# Patient Record
Sex: Female | Born: 1970 | Race: Black or African American | Hispanic: No | Smoking: Never smoker
Health system: Southern US, Community
[De-identification: ages and names within clinical notes are randomized; demographics above are authoritative.]

## PROBLEM LIST (undated history)

## (undated) ENCOUNTER — Inpatient Hospital Stay (HOSPITAL_COMMUNITY): Payer: Self-pay

## (undated) DIAGNOSIS — K219 Gastro-esophageal reflux disease without esophagitis: Secondary | ICD-10-CM

## (undated) DIAGNOSIS — O0383 Metabolic disorder following complete or unspecified spontaneous abortion: Secondary | ICD-10-CM

## (undated) DIAGNOSIS — R51 Headache: Secondary | ICD-10-CM

## (undated) DIAGNOSIS — D219 Benign neoplasm of connective and other soft tissue, unspecified: Secondary | ICD-10-CM

## (undated) DIAGNOSIS — J45909 Unspecified asthma, uncomplicated: Secondary | ICD-10-CM

## (undated) DIAGNOSIS — M2550 Pain in unspecified joint: Secondary | ICD-10-CM

## (undated) DIAGNOSIS — B999 Unspecified infectious disease: Secondary | ICD-10-CM

## (undated) DIAGNOSIS — M479 Spondylosis, unspecified: Secondary | ICD-10-CM

## (undated) HISTORY — DX: Metabolic disorder following complete or unspecified spontaneous abortion: O03.83

## (undated) HISTORY — DX: Headache: R51

## (undated) HISTORY — PX: APPENDECTOMY: SHX54

## (undated) HISTORY — DX: Benign neoplasm of connective and other soft tissue, unspecified: D21.9

## (undated) HISTORY — DX: Unspecified asthma, uncomplicated: J45.909

## (undated) HISTORY — PX: NASAL SINUS SURGERY: SHX719

## (undated) HISTORY — DX: Unspecified infectious disease: B99.9

## (undated) HISTORY — DX: Gastro-esophageal reflux disease without esophagitis: K21.9

## (undated) HISTORY — DX: Pain in unspecified joint: M25.50

---

## 2006-12-20 ENCOUNTER — Encounter: Admission: RE | Admit: 2006-12-20 | Discharge: 2006-12-20 | Payer: Self-pay | Admitting: Family Medicine

## 2007-07-01 ENCOUNTER — Other Ambulatory Visit: Admission: RE | Admit: 2007-07-01 | Discharge: 2007-07-01 | Payer: Self-pay | Admitting: Obstetrics and Gynecology

## 2008-10-23 ENCOUNTER — Ambulatory Visit (HOSPITAL_COMMUNITY): Admission: RE | Admit: 2008-10-23 | Discharge: 2008-10-23 | Payer: Self-pay | Admitting: Obstetrics and Gynecology

## 2010-07-24 DIAGNOSIS — D219 Benign neoplasm of connective and other soft tissue, unspecified: Secondary | ICD-10-CM

## 2010-07-24 HISTORY — DX: Benign neoplasm of connective and other soft tissue, unspecified: D21.9

## 2011-08-02 ENCOUNTER — Encounter: Payer: Self-pay | Admitting: Cardiovascular Disease

## 2011-08-02 ENCOUNTER — Encounter: Payer: Self-pay | Admitting: *Deleted

## 2011-08-02 ENCOUNTER — Ambulatory Visit (INDEPENDENT_AMBULATORY_CARE_PROVIDER_SITE_OTHER): Payer: Self-pay | Admitting: Cardiovascular Disease

## 2011-08-02 DIAGNOSIS — R079 Chest pain, unspecified: Secondary | ICD-10-CM | POA: Insufficient documentation

## 2011-08-02 DIAGNOSIS — J45909 Unspecified asthma, uncomplicated: Secondary | ICD-10-CM | POA: Insufficient documentation

## 2011-08-02 DIAGNOSIS — R0989 Other specified symptoms and signs involving the circulatory and respiratory systems: Secondary | ICD-10-CM | POA: Insufficient documentation

## 2011-08-02 HISTORY — DX: Other specified symptoms and signs involving the circulatory and respiratory systems: R09.89

## 2011-08-02 NOTE — Patient Instructions (Addendum)
Your physician recommends that you schedule a follow-up appointment in: AS NEEDED Your physician recommends that you continue on your current medications as directed. Please refer to the Current Medication list given to you today. Your physician has requested that you have a stress echocardiogram. For further information please visit [email or web address] Please follow instruction sheet as given. DX CHEST PAIN  Your physician has requested that you have an abdominal aorta duplex. During this test, an ultrasound is used to evaluate the aorta. Allow 30 minutes for this exam. Do not eat after midnight the day before and avoid carbonated beverages DX PALPABLE  ABD AORTA

## 2011-08-02 NOTE — Assessment & Plan Note (Signed)
Unusually prominent on exam as she is not that thin.  F/U US R/O AAA

## 2011-08-02 NOTE — Progress Notes (Signed)
Patient ID: Jamie Melton, female   DOB: 1970/08/17, 41 y.o.   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  Has allergies and asthma with inhaler.  No previously documented congenital or heart problem Originally from [place]  Works weekends on [address]  Pain most days this past month.  Atypical.  Resting  Can awake her from sleep.  Sharp.  An uneasy feeling in chest.  Lasts minutes.  No associated pleurisy or positional component.  Persistant.  But not worsening.  No gi overtones.  Walks in park 3x/week with occasional tightness from asthma but no pain.  No fever, arthritis, or recent trauma  No CRF;s   ROS: Denies fever, malais, weight loss, blurry vision, decreased visual acuity, cough, sputum, SOB, hemoptysis, pleuritic pain, palpitaitons, heartburn, abdominal pain, melena, lower extremity edema, claudication, or rash.  All other systems reviewed and negative   General: Affect appropriate Healthy:  appears stated age HEENT: normal Neck supple with no adenopathy JVP normal no bruits no thyromegaly Lungs clear with no wheezing and good diaphragmatic motion Heart:  S1/S2 no murmur,rub, gallop or click PMI normal Abdomen: benighn, BS positve, no tenderness, abdominal aorta seems prominent and palpable but not tender no bruit.  No HSM or HJR Distal pulses intact with no bruits No edema Neuro non-focal Skin warm and dry No muscular weakness  Medications Current Outpatient Prescriptions  Medication Sig Dispense Refill  . albuterol (PROVENTIL,VENTOLIN) 90 MCG/ACT inhaler Inhale 2 puffs into the lungs every 6 (six) hours as needed.      . diphenhydrAMINE (SOMINEX) 25 MG tablet Take 25 mg by mouth as needed.      . fexofenadine (ALLEGRA) 180 MG tablet Take 180 mg by mouth daily.      . multivitamin (THERAGRAN) per tablet Take 1 tablet by mouth daily.      Marland Kitchen acetaminophen (TYLENOL) 500 MG tablet Take 500 mg by mouth every 6 (six) hours as needed.        Allergies Review of  patient's allergies indicates no known allergies.  Family History: No family history on file.  Social History: History   Social History  . Marital Status: Married    Spouse Name: N/A    Number of Children: N/A  . Years of Education: N/A   Occupational History  . Not on file.   Social History Main Topics  . Smoking status: Never Smoker   . Smokeless tobacco: Not on file  . Alcohol Use: Yes     occasional glass of wine  . Drug Use: No  . Sexually Active:    Other Topics Concern  . Not on file   Social History Narrative  . No narrative on file    Electrocardiogram: NSR rate 76  LAD otherwise normal ECG  Assessment and Plan

## 2011-08-02 NOTE — Assessment & Plan Note (Signed)
Stable continue PRN inhaler

## 2011-08-02 NOTE — Assessment & Plan Note (Signed)
Atypical ECG and exam unrevealing  Stress echo

## 2011-08-04 ENCOUNTER — Other Ambulatory Visit (HOSPITAL_COMMUNITY): Payer: Self-pay | Admitting: Radiology

## 2011-08-04 ENCOUNTER — Ambulatory Visit (HOSPITAL_COMMUNITY): Payer: 59 | Attending: Cardiology | Admitting: Radiology

## 2011-08-04 ENCOUNTER — Ambulatory Visit (HOSPITAL_BASED_OUTPATIENT_CLINIC_OR_DEPARTMENT_OTHER): Payer: 59 | Admitting: Radiology

## 2011-08-04 DIAGNOSIS — R079 Chest pain, unspecified: Secondary | ICD-10-CM | POA: Insufficient documentation

## 2011-08-04 DIAGNOSIS — J45909 Unspecified asthma, uncomplicated: Secondary | ICD-10-CM | POA: Insufficient documentation

## 2011-08-04 DIAGNOSIS — R072 Precordial pain: Secondary | ICD-10-CM

## 2011-08-04 DIAGNOSIS — R0989 Other specified symptoms and signs involving the circulatory and respiratory systems: Secondary | ICD-10-CM

## 2011-08-07 ENCOUNTER — Encounter (INDEPENDENT_AMBULATORY_CARE_PROVIDER_SITE_OTHER): Payer: 59 | Admitting: Cardiology

## 2011-08-07 ENCOUNTER — Telehealth: Payer: Self-pay | Admitting: Cardiovascular Disease

## 2011-08-07 DIAGNOSIS — R109 Unspecified abdominal pain: Secondary | ICD-10-CM

## 2011-08-07 DIAGNOSIS — R0989 Other specified symptoms and signs involving the circulatory and respiratory systems: Secondary | ICD-10-CM

## 2011-08-07 NOTE — Telephone Encounter (Signed)
FU Call: Pt returning call to Christine. Please return pt call to discuss further.  

## 2011-08-07 NOTE — Telephone Encounter (Signed)
PT  AWARE OF STRESS ECHO RESULTS ./CY 

## 2012-08-15 ENCOUNTER — Ambulatory Visit: Payer: 59 | Admitting: Obstetrics and Gynecology

## 2012-08-15 DIAGNOSIS — O26849 Uterine size-date discrepancy, unspecified trimester: Secondary | ICD-10-CM

## 2012-08-15 DIAGNOSIS — Z331 Pregnant state, incidental: Secondary | ICD-10-CM

## 2012-08-15 LAB — POCT URINALYSIS DIPSTICK
Bilirubin, UA: NEGATIVE
Blood, UA: NEGATIVE
Glucose, UA: NEGATIVE
Ketones, UA: NEGATIVE
Spec Grav, UA: 1.02

## 2012-08-15 NOTE — Progress Notes (Signed)
NOB interview. U/S ordered for viability per protocol due to hx SAB.

## 2012-08-16 LAB — PRENATAL PANEL VII
Antibody Screen: NEGATIVE
Basophils Relative: 1 % (ref 0–1)
Eosinophils Absolute: 0.8 10*3/uL — ABNORMAL HIGH (ref 0.0–0.7)
Hemoglobin: 12 g/dL (ref 12.0–15.0)
Hepatitis B Surface Ag: NEGATIVE
MCH: 31.7 pg (ref 26.0–34.0)
MCHC: 34.3 g/dL (ref 30.0–36.0)
Monocytes Absolute: 0.7 10*3/uL (ref 0.1–1.0)
Monocytes Relative: 7 % (ref 3–12)
Neutrophils Relative %: 69 % (ref 43–77)
Rh Type: POSITIVE

## 2012-08-16 LAB — GC/CHLAMYDIA PROBE AMP: GC Probe RNA: NEGATIVE

## 2012-08-18 ENCOUNTER — Encounter: Payer: Self-pay | Admitting: Obstetrics and Gynecology

## 2012-08-18 DIAGNOSIS — D219 Benign neoplasm of connective and other soft tissue, unspecified: Secondary | ICD-10-CM | POA: Insufficient documentation

## 2012-08-18 DIAGNOSIS — O09529 Supervision of elderly multigravida, unspecified trimester: Secondary | ICD-10-CM | POA: Insufficient documentation

## 2012-08-19 LAB — HEMOGLOBINOPATHY EVALUATION
Hgb A2 Quant: 3 % (ref 2.2–3.2)
Hgb A: 96.6 % — ABNORMAL LOW (ref 96.8–97.8)

## 2012-08-26 ENCOUNTER — Other Ambulatory Visit: Payer: 59

## 2012-08-29 ENCOUNTER — Ambulatory Visit: Payer: 59 | Admitting: Obstetrics and Gynecology

## 2012-08-29 ENCOUNTER — Ambulatory Visit: Payer: 59

## 2012-08-29 ENCOUNTER — Encounter: Payer: Self-pay | Admitting: Obstetrics and Gynecology

## 2012-08-29 ENCOUNTER — Encounter: Payer: 59 | Admitting: Obstetrics and Gynecology

## 2012-08-29 VITALS — BP 90/66 | Wt 136.0 lb

## 2012-08-29 DIAGNOSIS — Z331 Pregnant state, incidental: Secondary | ICD-10-CM

## 2012-08-29 DIAGNOSIS — O09529 Supervision of elderly multigravida, unspecified trimester: Secondary | ICD-10-CM

## 2012-08-29 DIAGNOSIS — O219 Vomiting of pregnancy, unspecified: Secondary | ICD-10-CM

## 2012-08-29 DIAGNOSIS — O26849 Uterine size-date discrepancy, unspecified trimester: Secondary | ICD-10-CM

## 2012-08-29 LAB — US OB TRANSVAGINAL

## 2012-08-29 MED ORDER — DOXYLAMINE-PYRIDOXINE 10-10 MG PO TBEC: 2.0000 | DELAYED_RELEASE_TABLET | Freq: Every day | ORAL | Status: DC

## 2012-08-29 NOTE — Progress Notes (Signed)
Here for [place] for viability, due to AMA and hx of 1 SAB. Has nausea. Wants genetic screening. [place] today shows SIUP with +FHT. Plan: Keep scheduled NOB visit on 09/19/12 with AVS. Rx Diclegis 20 mg po q hs--if ineffective, may add additional 10 mg in afternoon. Patient to f/u as needed.  Nigel Bridgeman, CNM

## 2012-08-29 NOTE — Progress Notes (Signed)
[redacted]w[redacted]d NOB scheduled 09/19/12 with AVS Pt wants genetic screening. Ultrasound: singleton pregnancy, yolk sac seen, anteverted uterus, CRL is concordant with LMP GA/EDD, normal ovaries/adnexa, cx closed

## 2012-09-07 ENCOUNTER — Other Ambulatory Visit: Payer: Self-pay

## 2012-09-09 ENCOUNTER — Encounter: Payer: 59 | Admitting: Obstetrics and Gynecology

## 2012-09-19 ENCOUNTER — Encounter: Payer: Self-pay | Admitting: Obstetrics and Gynecology

## 2012-09-19 ENCOUNTER — Ambulatory Visit: Payer: 59 | Admitting: Obstetrics and Gynecology

## 2012-09-19 ENCOUNTER — Encounter: Payer: 59 | Admitting: Obstetrics and Gynecology

## 2012-09-19 VITALS — BP 90/60 | Wt 139.0 lb

## 2012-09-19 NOTE — Progress Notes (Signed)
[facility] NEW OB EXAMINATION   Jamie Melton is a 42 y.o. female, G2P0010, who presents at [redacted]w[redacted]d gestation for a new obstetrical examination. Her last menstrual period was not normal.  An ultrasound on August 29, 2012 showed an 8 week and one-day gestation.  Her due date is April 09, 2013.  She has nausea and vomiting of early pregnancy.  Her age is greater than 35.  She has a history of asthma.  The patient has had her aorta evaluated and there is no aneurysm.  The following portions of the patient's history were reviewed and updated as appropriate: allergies, current medications, past family history, past medical history, past social history, past surgical history and problem list.  OB History   Grav Para Term Preterm Abortions TAB SAB Ect Mult Living   2    1  1          Past Medical History  Diagnosis Date  . Myalgia and myositis, unspecified   . Pain in joint, multiple sites   . Unspecified abortion, complicated by metabolic disorder, unspecified   . Infection     UTI  . Headache     FREQUENT  . Fibroid 2012  . Asthma     DR Sharyn Lull    Past Surgical History  Procedure Laterality Date  . Appendectomy    . Nasal sinus surgery      Family History  Problem Relation Age of Onset  . Arthritis Mother   . Kidney disease Mother     STONES  . Thyroid disease Mother   . Hypertension Father     Social History:  reports that she has never smoked. She has never used smokeless tobacco. She reports that  drinks alcohol. She reports that she does not use illicit drugs.  Allergies: No Known Allergies  Medications: I have reviewed the patient's current medications.   Objective:    BP 90/60  Wt 139 lb (63.05 kg)  BMI 26.28 kg/m2  LMP 07/06/2012    Weight:  Wt Readings from Last 1 Encounters:  09/19/12 139 lb (63.05 kg)          BMI: Body mass index is 26.28 kg/(m^2).  General Appearance: Alert, appropriate appearance for age. No acute distress HEENT: Grossly  normal Neck / Thyroid: Supple, no masses, nodes or enlargement Lungs: clear to auscultation bilaterally Back: No CVA tenderness Breast Exam: No masses or nodes.No dimpling, nipple retraction or discharge. Cardiovascular: Regular rate and rhythm. S1, S2, no murmur Gastrointestinal: Soft, non-tender, no masses or organomegaly.                               Fundal height: 11 weeks                               Fetal heart tones audible: yes  ++++++++++++++++++++++++++++++++++++++++++++++++++++++++  Pelvic Exam: External genitalia: normal general appearance Vaginal: normal without tenderness, induration or masses and relaxation: No Cervix: normal appearance Adnexa: normal bimanual exam Uterus: gravid, nontender, 11 weeks size  ++++++++++++++++++++++++++++++++++++++++++++++++++++++++  Lymphatic Exam: Non-palpable nodes in neck, clavicular, axillary, or inguinal regions Neurologic: Normal speech, no tremor  Psychiatric: Alert and oriented, appropriate affect.  Prenatal labs: ABO, Rh: B/POS/-- (01/23 1148) Antibody: NEG (01/23 1148) Rubella:  immune RPR: NON REAC (01/23 1148)  HBsAg: NEGATIVE (01/23 1148)  HIV: NON REACTIVE (01/23 1148)  GBS:   pending until the third  trimester Hemoglobin 12.0 Platelets 218,000 Sickle cell screen negative Urine culture negative Gonorrhea negative Chlamydia negative  Wet Prep:   Previously done:            no                     If no: Whiff:                     Negative                              Clue cells:             no                              PH:                        4.5                              Yeast:                    no                              Trichomoniasis:    no   Assessment:   42 y.o. female G2P0010 at [redacted]w[redacted]d gestation ( EDC is April 09, 2013) by: Ultrasound:                               yes                                Advanced maternal age  Asthma   Plan:    We discussed routine pregnancy  issues:  Toxoplasmosis was reviewed.  The patient was told to avoid cat liter boxes and feces.  The patient was told to avoid predator fish including tuna because of our concerns for mercury consumption.  The patient was told to avoid soft cheeses.  The patient was told to be sure that all lunch meats are well cooked.  Genetic screening was discussed. The patient wants a [facility] screen.  Our model for pregnancy management was reviewed.  Proper diet and exercise reviewed.  Return to office in 4 weeks.  Medications include:  Prenatal vitamins  Mylinda Latina.D.

## 2012-09-19 NOTE — Progress Notes (Signed)
[redacted]w[redacted]d Pt has no complaints

## 2012-09-30 ENCOUNTER — Telehealth: Payer: Self-pay | Admitting: Obstetrics and Gynecology

## 2012-09-30 NOTE — Telephone Encounter (Signed)
TC to pt. Is requesting Rf Diclegis. Pt checked Rx bottle. Has RFs available. Pt to call pharmacy.  Pt verbalizes comprehension. States med is helping.

## 2012-10-17 ENCOUNTER — Other Ambulatory Visit: Payer: Self-pay | Admitting: Obstetrics and Gynecology

## 2012-10-18 LAB — AFP, QUAD SCREEN
AFP: 39.3 IU/mL
Age Alone: 1:75 {titer}
Curr Gest Age: 15.1 wks.days
Down Syndrome Scr Risk Est: 1:1170 {titer}
HCG, Total: 26424 m[IU]/mL
INH: 365.7 pg/mL
MoM for INH: 1.83
Osb Risk: 1:2810 {titer}

## 2013-02-14 ENCOUNTER — Inpatient Hospital Stay (HOSPITAL_COMMUNITY)
Admission: AD | Admit: 2013-02-14 | Discharge: 2013-02-14 | Disposition: A | Discharge status: Home / Self Care | Payer: 59 | Source: Ambulatory Visit | Attending: Obstetrics and Gynecology | Admitting: Obstetrics and Gynecology

## 2013-02-14 ENCOUNTER — Encounter (HOSPITAL_COMMUNITY): Payer: Self-pay | Admitting: *Deleted

## 2013-02-14 DIAGNOSIS — O4703 False labor before 37 completed weeks of gestation, third trimester: Secondary | ICD-10-CM

## 2013-02-14 DIAGNOSIS — R109 Unspecified abdominal pain: Secondary | ICD-10-CM | POA: Insufficient documentation

## 2013-02-14 DIAGNOSIS — O4700 False labor before 37 completed weeks of gestation, unspecified trimester: Secondary | ICD-10-CM | POA: Insufficient documentation

## 2013-02-14 DIAGNOSIS — O99891 Other specified diseases and conditions complicating pregnancy: Secondary | ICD-10-CM | POA: Insufficient documentation

## 2013-02-14 DIAGNOSIS — D219 Benign neoplasm of connective and other soft tissue, unspecified: Secondary | ICD-10-CM

## 2013-02-14 DIAGNOSIS — O09523 Supervision of elderly multigravida, third trimester: Secondary | ICD-10-CM

## 2013-02-14 LAB — URINALYSIS, ROUTINE W REFLEX MICROSCOPIC
Ketones, ur: NEGATIVE mg/dL
Leukocytes, UA: NEGATIVE
Nitrite: NEGATIVE
Urobilinogen, UA: 0.2 mg/dL (ref 0.0–1.0)
pH: 6 (ref 5.0–8.0)

## 2013-02-14 LAB — WET PREP, GENITAL: Trich, Wet Prep: NONE SEEN

## 2013-02-14 LAB — OB RESULTS CONSOLE GC/CHLAMYDIA: Gonorrhea: NEGATIVE

## 2013-02-14 NOTE — MAU Note (Signed)
Patient states having cramping since 1am. Called the office and was advised to drink lots of water, take a tylenol, and rest. Called back to office due to cramping that continued and progressively got worse; instructed to come to MAU. Denies LOF nor bleeding.

## 2013-02-14 NOTE — MAU Provider Note (Signed)
History   41yo G2P0 at [redacted]w[redacted]d presents with tightening and occasional cramping since 0100 this am.  Denies VB, UCs, recent fever, resp or GI c/o's, UTI or PIH s/s.   Chief Complaint  Patient presents with  . Abdominal Pain  . Abdominal Cramping    OB History   Grav Para Term Preterm Abortions TAB SAB Ect Mult Living   2    1  1          Past Medical History  Diagnosis Date  . Myalgia and myositis, unspecified   . Pain in joint, multiple sites   . Unspecified abortion, complicated by metabolic disorder, unspecified   . Infection     UTI  . Headache(784.0)     FREQUENT  . Fibroid 2012  . Asthma     DR Sharyn Lull    Past Surgical History  Procedure Laterality Date  . Appendectomy    . Nasal sinus surgery      Family History  Problem Relation Age of Onset  . Arthritis Mother   . Kidney disease Mother     STONES  . Thyroid disease Mother   . Hypertension Father     History  Substance Use Topics  . Smoking status: Never Smoker   . Smokeless tobacco: Never Used  . Alcohol Use: Yes     Comment: occasional glass of wine    Allergies: No Known Allergies  Prescriptions prior to admission  Medication Sig Dispense Refill  . acetaminophen (TYLENOL) 500 MG tablet Take 500 mg by mouth every 6 (six) hours as needed.      Marland Kitchen albuterol (PROVENTIL,VENTOLIN) 90 MCG/ACT inhaler Inhale 2 puffs into the lungs every 6 (six) hours as needed.      . diphenhydrAMINE (SOMINEX) 25 MG tablet Take 25 mg by mouth as needed.      . Doxylamine-Pyridoxine (DICLEGIS) 10-10 MG TBEC Take 2 tablets by mouth at bedtime.  60 tablet  2  . fexofenadine (ALLEGRA) 180 MG tablet Take 180 mg by mouth daily.      . multivitamin (THERAGRAN) per tablet Take 1 tablet by mouth daily.      . Prenatal Vit-Fe Sulfate-FA (PRENATAL VITAMIN PO) Take 1 tablet by mouth. OTC        ROS: see HPI above, all other systems are negative   Physical Exam   Blood pressure 122/66, pulse 79, temperature 97.8 F (36.6 C),  temperature source Oral, resp. rate 18, height 5\' 1"  (1.549 m), weight 155 lb (70.308 kg), last menstrual period 07/06/2012.  Chest: Clear Heart: RRR Abdomen: gravid, NT Extremities: WNL  Dilation: Closed Effacement (%): 30 Cervical Position: Posterior Exam by:: J. Elvina Bosch CNM Station -2 Soft  FHT: Appropriate for GA UCs: Q 1-3 min  ED Course  IUP at [redacted]w[redacted]d ?PTL  FFN - neg Wet prep - neg GC/CT - pending  D/c home with PTL precautions F/u with ROB on 02/20/13    Haroldine Laws CNM, MSN 02/14/2013 1:14 PM

## 2013-02-14 NOTE — MAU Note (Signed)
Jamie Melton presents with abdominal cramping/pain that started at 0100. She is [redacted]w[redacted]d; G2P0, AMA. Denies bleeding

## 2013-02-15 LAB — GC/CHLAMYDIA PROBE AMP
CT Probe RNA: NEGATIVE
GC Probe RNA: NEGATIVE

## 2013-03-20 LAB — OB RESULTS CONSOLE GBS: GBS: NEGATIVE

## 2013-03-29 ENCOUNTER — Inpatient Hospital Stay (HOSPITAL_COMMUNITY): Payer: 59 | Admitting: Anesthesiology

## 2013-03-29 ENCOUNTER — Encounter (HOSPITAL_COMMUNITY): Payer: Self-pay | Admitting: Anesthesiology

## 2013-03-29 ENCOUNTER — Encounter (HOSPITAL_COMMUNITY): Payer: Self-pay | Admitting: *Deleted

## 2013-03-29 ENCOUNTER — Inpatient Hospital Stay (HOSPITAL_COMMUNITY)
Admission: AD | Admit: 2013-03-29 | Discharge: 2013-04-02 | DRG: 765 | Disposition: A | Discharge status: Home / Self Care | Payer: 59 | Source: Ambulatory Visit | Attending: Obstetrics and Gynecology | Admitting: Obstetrics and Gynecology

## 2013-03-29 DIAGNOSIS — D696 Thrombocytopenia, unspecified: Secondary | ICD-10-CM

## 2013-03-29 DIAGNOSIS — O34599 Maternal care for other abnormalities of gravid uterus, unspecified trimester: Secondary | ICD-10-CM | POA: Diagnosis present

## 2013-03-29 DIAGNOSIS — D4959 Neoplasm of unspecified behavior of other genitourinary organ: Secondary | ICD-10-CM | POA: Diagnosis present

## 2013-03-29 DIAGNOSIS — O4290 Premature rupture of membranes, unspecified as to length of time between rupture and onset of labor, unspecified weeks of gestation: Secondary | ICD-10-CM

## 2013-03-29 DIAGNOSIS — D259 Leiomyoma of uterus, unspecified: Secondary | ICD-10-CM | POA: Diagnosis present

## 2013-03-29 DIAGNOSIS — O09529 Supervision of elderly multigravida, unspecified trimester: Secondary | ICD-10-CM | POA: Diagnosis present

## 2013-03-29 DIAGNOSIS — Z302 Encounter for sterilization: Secondary | ICD-10-CM

## 2013-03-29 DIAGNOSIS — D219 Benign neoplasm of connective and other soft tissue, unspecified: Secondary | ICD-10-CM | POA: Diagnosis present

## 2013-03-29 DIAGNOSIS — D689 Coagulation defect, unspecified: Secondary | ICD-10-CM | POA: Diagnosis present

## 2013-03-29 HISTORY — DX: Premature rupture of membranes, unspecified as to length of time between rupture and onset of labor, unspecified weeks of gestation: O42.90

## 2013-03-29 HISTORY — DX: Thrombocytopenia, unspecified: D69.6

## 2013-03-29 LAB — CBC
HCT: 34.5 % — ABNORMAL LOW (ref 36.0–46.0)
MCHC: 34.8 g/dL (ref 30.0–36.0)
Platelets: 88 10*3/uL — ABNORMAL LOW (ref 150–400)
RDW: 12.6 % (ref 11.5–15.5)
WBC: 9 10*3/uL (ref 4.0–10.5)

## 2013-03-29 LAB — CBC WITH DIFFERENTIAL/PLATELET
Basophils Absolute: 0 10*3/uL (ref 0.0–0.1)
Eosinophils Absolute: 0.2 10*3/uL (ref 0.0–0.7)
Lymphocytes Relative: 13 % (ref 12–46)
Lymphs Abs: 1.6 10*3/uL (ref 0.7–4.0)
MCH: 34.2 pg — ABNORMAL HIGH (ref 26.0–34.0)
Neutrophils Relative %: 77 % (ref 43–77)
Platelets: 95 10*3/uL — ABNORMAL LOW (ref 150–400)
RBC: 3.54 MIL/uL — ABNORMAL LOW (ref 3.87–5.11)
RDW: 12.4 % (ref 11.5–15.5)
WBC: 12.6 10*3/uL — ABNORMAL HIGH (ref 4.0–10.5)

## 2013-03-29 LAB — ABO/RH: ABO/RH(D): B POS

## 2013-03-29 LAB — RPR: RPR Ser Ql: NONREACTIVE

## 2013-03-29 MED ORDER — LACTATED RINGERS IV SOLN: INTRAVENOUS | Status: DC

## 2013-03-29 MED ORDER — PHENYLEPHRINE 40 MCG/ML (10ML) SYRINGE FOR IV PUSH (FOR BLOOD PRESSURE SUPPORT): 80.0000 ug | PREFILLED_SYRINGE | INTRAVENOUS | Status: DC | PRN

## 2013-03-29 MED ORDER — FLEET ENEMA 7-19 GM/118ML RE ENEM: 1.0000 | ENEMA | RECTAL | Status: DC | PRN

## 2013-03-29 MED ORDER — CITRIC ACID-SODIUM CITRATE 334-500 MG/5ML PO SOLN
30.0000 mL | ORAL | Status: DC | PRN
  Administered 2013-03-30: 30 mL via ORAL
  Filled 2013-03-29: qty 15

## 2013-03-29 MED ORDER — TERBUTALINE SULFATE 1 MG/ML IJ SOLN: 0.2500 mg | Freq: Once | INTRAMUSCULAR | Status: AC | PRN

## 2013-03-29 MED ORDER — EPHEDRINE 5 MG/ML INJ: 10.0000 mg | INTRAVENOUS | Status: DC | PRN

## 2013-03-29 MED ORDER — IBUPROFEN 600 MG PO TABS: 600.0000 mg | ORAL_TABLET | Freq: Four times a day (QID) | ORAL | Status: DC | PRN

## 2013-03-29 MED ORDER — OXYTOCIN 40 UNITS IN LACTATED RINGERS INFUSION - SIMPLE MED
1.0000 m[IU]/min | INTRAVENOUS | Status: DC
  Administered 2013-03-29: 1 m[IU]/min via INTRAVENOUS
  Filled 2013-03-29: qty 1000

## 2013-03-29 MED ORDER — OXYTOCIN 40 UNITS IN LACTATED RINGERS INFUSION - SIMPLE MED: 62.5000 mL/h | INTRAVENOUS | Status: DC

## 2013-03-29 MED ORDER — ONDANSETRON HCL 4 MG/2ML IJ SOLN: 4.0000 mg | Freq: Four times a day (QID) | INTRAMUSCULAR | Status: DC | PRN

## 2013-03-29 MED ORDER — OXYTOCIN BOLUS FROM INFUSION: 500.0000 mL | INTRAVENOUS | Status: DC

## 2013-03-29 MED ORDER — ACETAMINOPHEN 325 MG PO TABS: 650.0000 mg | ORAL_TABLET | ORAL | Status: DC | PRN

## 2013-03-29 MED ORDER — FENTANYL 2.5 MCG/ML BUPIVACAINE 1/10 % EPIDURAL INFUSION (WH - ANES)
14.0000 mL/h | INTRAMUSCULAR | Status: DC | PRN
  Filled 2013-03-29: qty 125

## 2013-03-29 MED ORDER — LACTATED RINGERS IV SOLN
500.0000 mL | INTRAVENOUS | Status: DC | PRN
  Administered 2013-03-29 – 2013-03-30 (×2): 500 mL via INTRAVENOUS

## 2013-03-29 MED ORDER — EPHEDRINE 5 MG/ML INJ
10.0000 mg | INTRAVENOUS | Status: DC | PRN
  Administered 2013-03-29: 10 mg via INTRAVENOUS
  Filled 2013-03-29: qty 4

## 2013-03-29 MED ORDER — LIDOCAINE HCL (PF) 1 % IJ SOLN
INTRAMUSCULAR | Status: DC | PRN
  Administered 2013-03-29 (×2): 8 mL

## 2013-03-29 MED ORDER — PHENYLEPHRINE 40 MCG/ML (10ML) SYRINGE FOR IV PUSH (FOR BLOOD PRESSURE SUPPORT)
80.0000 ug | PREFILLED_SYRINGE | INTRAVENOUS | Status: DC | PRN
  Filled 2013-03-29: qty 5

## 2013-03-29 MED ORDER — DIPHENHYDRAMINE HCL 50 MG/ML IJ SOLN: 12.5000 mg | INTRAMUSCULAR | Status: DC | PRN

## 2013-03-29 MED ORDER — LIDOCAINE HCL (PF) 1 % IJ SOLN: 30.0000 mL | INTRAMUSCULAR | Status: DC | PRN

## 2013-03-29 MED ORDER — LACTATED RINGERS IV SOLN: 500.0000 mL | Freq: Once | INTRAVENOUS | Status: DC

## 2013-03-29 MED ORDER — BUTORPHANOL TARTRATE 1 MG/ML IJ SOLN
1.0000 mg | INTRAMUSCULAR | Status: DC | PRN
  Filled 2013-03-29: qty 1

## 2013-03-29 MED ORDER — FENTANYL 2.5 MCG/ML BUPIVACAINE 1/10 % EPIDURAL INFUSION (WH - ANES)
INTRAMUSCULAR | Status: DC | PRN
  Administered 2013-03-29: 14 mL/h via EPIDURAL

## 2013-03-29 MED ORDER — OXYCODONE-ACETAMINOPHEN 5-325 MG PO TABS: 1.0000 | ORAL_TABLET | ORAL | Status: DC | PRN

## 2013-03-29 NOTE — MAU Note (Signed)
Pt states that she was getting ready to go to work today and had a large gush clear fluid, denies any bleeding or contractions.

## 2013-03-29 NOTE — H&P (Signed)
Jamie Melton is a 42 y.o. female, G2P0010 at 67 3/7 weeks, presenting for SROM with large gush of fluid at 6:30am.  Reports mild cramping, but "not different than what I'm used to".  Denies bleeding, fever, d/c.  Reports +FM.  She will be using Renaldo Harrison for doula support--hopes to avoid epidural, but not opposed to use if needed.  History of present pregnancy: Patient entered care at 54 1/7 weeks. EDC of 04/09/13 was established by 8 week [place].   Anatomy scan:  18 3/7 weeks, with normal findings and an anterior placenta.   Additional [place] evaluations:  None Significant prenatal events:  Glycosuria at 35 6/7 weeks, with CBG 192--had tea with sugar just before visit.  No subsequent occurrences.  Last evaluation:  03/25/13, with cervix 1 cm, 50%, vtx, -2.  Had negative FFN 02/14/13.  Patient Active Problem List   Diagnosis Date Noted  . PROM (premature rupture of membranes) at term 03/29/2013  . Fibroid 08/18/2012  . AMA (advanced maternal age) multigravida 35+ 08/18/2012  . Chest pain 08/02/2011  . Asthma 08/02/2011  . Palpable abd. aorta 08/02/2011  Asthma stable--only uses inhaler for prn use.  No recent exacerbations or issues. No fibroid seen on OB US this pregnancy--hx of one in 2012. Had evaluation of chest pain by Dr. Eden Emms in 2013, with no pathology noted.  History OB History   Grav Para Term Preterm Abortions TAB SAB Ect Mult Living   2    1  1        2007--SAB  Past Medical History  Diagnosis Date  . Myalgia and myositis, unspecified   . Pain in joint, multiple sites   . Unspecified abortion, complicated by metabolic disorder, unspecified   . Infection     UTI  . Headache(784.0)     FREQUENT  . Fibroid 2012  . Asthma     DR Sharyn Lull   Past Surgical History  Procedure Laterality Date  . Appendectomy    . Nasal sinus surgery     Family History: family history includes Arthritis in her mother; Hypertension in her father; Kidney disease in her mother; Thyroid  disease in her mother.  Social History:  reports that she has never smoked. She has never used smokeless tobacco. She reports that  drinks alcohol. She reports that she does not use illicit drugs. Patient is a Designer, jewellery at [employer].  She is currently legally separated.  She is from [place].   Prenatal Transfer Tool  Maternal Diabetes: No Genetic Screening: Normal [facility] Maternal Ultrasounds/Referrals: Normal Fetal Ultrasounds or other Referrals:  None Maternal Substance Abuse:  No Significant Maternal Medications:  None Significant Maternal Lab Results:  Lab values include: Group B Strep negative Other Comments:  None  ROS  Dilation: 1 Effacement (%): 80 Station: -3 Exam by:: V Melaney Tellefsen CNM Blood pressure 121/73, pulse 78, temperature 99.2 F (37.3 C), temperature source Oral, resp. rate 18, height 5\' 1"  (1.549 m), weight 161 lb (73.029 kg), last menstrual period 07/06/2012.  Exam Physical Exam  Chest clear Heart RRR without murmur Abd gravid, NT Pelvic--leaking clear fluid, cervix 1 cm, 80%, vtx, -2. Ext WNL  FHR Category 2--baseline 150s, decreased varibility, no decels, 1-2 accels, negative spontaneous CST. UCs q 3-5 min, mild  Prenatal labs: ABO, Rh: B/POS/-- (01/23 1148) Antibody: NEG (01/23 1148) Rubella: 5.26 (01/23 1148) RPR: NON REAC (01/23 1148)  HBsAg: NEGATIVE (01/23 1148)  HIV: NON REACTIVE (01/23 1148)  GBS: Negative (08/28 0000)  Hgb electrophoresis  WNL FFN negative 02/14/13. Glucola WNL Cultures negative at NOB and on 02/14/13 [facility] and [facility] screens WNL. Platelets 218 at NOB, 140 at glucola visit. Hgb 12 at NOB, 12.1 with glucola.  Assessment/Plan: IUP at 38 3/7 weeks SROM at 6:30am, minimal labor GBS negative  Plan: Admit to Birthing Suite per consult with Dr. Pennie Rushing Routine [facility] orders Observe labor status at present--augment if no progression in labor by 12:30pm. IV hydration Continuous monitoring at present--if fetus becomes  more reactive, will allow intermittent monitoring.   Nigel Bridgeman 03/29/2013, 9:52 AM

## 2013-03-29 NOTE — Anesthesia Preprocedure Evaluation (Signed)
Anesthesia Evaluation  Patient identified by MRN, date of birth, ID band Patient awake    Reviewed: Allergy & Precautions, H&P , NPO status , Patient's Chart, lab work & pertinent test results  Airway Mallampati: I TM Distance: >3 FB Neck ROM: full    Dental no notable dental hx.    Pulmonary    Pulmonary exam normal       Cardiovascular negative cardio ROS      Neuro/Psych negative psych ROS   GI/Hepatic negative GI ROS, Neg liver ROS,   Endo/Other  negative endocrine ROS  Renal/GU negative Renal ROS  negative genitourinary   Musculoskeletal   Abdominal Normal abdominal exam  (+)   Peds  Hematology negative hematology ROS (+)   Anesthesia Other Findings   Reproductive/Obstetrics (+) Pregnancy                           Anesthesia Physical Anesthesia Plan  ASA: II  Anesthesia Plan: Epidural   Post-op Pain Management:    Induction:   Airway Management Planned:   Additional Equipment:   Intra-op Plan:   Post-operative Plan:   Informed Consent: I have reviewed the patients History and Physical, chart, labs and discussed the procedure including the risks, benefits and alternatives for the proposed anesthesia with the patient or authorized representative who has indicated his/her understanding and acceptance.     Plan Discussed with:   Anesthesia Plan Comments:         Anesthesia Quick Evaluation

## 2013-03-29 NOTE — MAU Note (Addendum)
Pt ambulating in hallway waiting on bed in birthing suites.

## 2013-03-29 NOTE — Progress Notes (Signed)
  Subjective: Now on Birthing Suite--feels contractions are stronger and more frequent.  Doula, Tequila, and doula-in-training at bedside.  Objective: BP 128/78  Pulse 88  Temp(Src) 97.8 F (36.6 C) (Oral)  Resp 18  Ht 5\' 1"  (1.549 m)  Wt 161 lb (73.029 kg)  BMI 30.44 kg/m2  LMP 07/06/2012      FHT:  Category 1 UC:   regular, every 3 minutes, mild/moderate  Results for orders placed during the hospital encounter of 03/29/13 (from the past 24 hour(s))  CBC     Status: Abnormal   Collection Time    03/29/13  9:40 AM      Result Value Range   WBC 9.0  4.0 - 10.5 K/uL   RBC 3.57 (*) 3.87 - 5.11 MIL/uL   Hemoglobin 12.0  12.0 - 15.0 g/dL   HCT 16.1 (*) 09.6 - 04.5 %   MCV 96.6  78.0 - 100.0 fL   MCH 33.6  26.0 - 34.0 pg   MCHC 34.8  30.0 - 36.0 g/dL   RDW 40.9  81.1 - 91.4 %   Platelets 88 (*) 150 - 400 K/uL     Assessment / Plan: Early labor Thrombocytopenia Reviewed options for augmentation or continued observation of labor status.  Patient wishes to continue to observe at present. Will allow to ambulate--intermittent monitoring. Will inform Anesthesia of platelet ct.  Nigel Bridgeman 03/29/2013, 2:20 PM

## 2013-03-29 NOTE — Anesthesia Procedure Notes (Signed)
Epidural Patient location during procedure: OB Start time: 03/29/2013 11:17 PM End time: 03/29/2013 11:21 PM  Staffing Anesthesiologist: Sandrea Hughs Performed by: anesthesiologist   Preanesthetic Checklist Completed: patient identified, surgical consent, pre-op evaluation, timeout performed, IV checked, risks and benefits discussed and monitors and equipment checked  Epidural Patient position: sitting Prep: site prepped and draped and DuraPrep Patient monitoring: continuous pulse ox and blood pressure Approach: midline Injection technique: LOR air  Needle:  Needle type: Tuohy  Needle gauge: 17 G Needle length: 9 cm and 9 Needle insertion depth: 6 cm Catheter type: closed end flexible Catheter size: 19 Gauge Catheter at skin depth: 11 cm Test dose: negative and Other  Assessment Sensory level: T9 Events: blood not aspirated, injection not painful, no injection resistance, negative IV test and no paresthesia  Additional Notes Reason for block:procedure for pain

## 2013-03-29 NOTE — Progress Notes (Signed)
Patient ID: Jamie Melton, female   DOB: 1970/08/14, 42 y.o.   MRN: [id number] Jamie Melton is a 42 y.o. G2P0010 at [redacted]w[redacted]d admitted for SROM  Subjective: On hands and knees, doula at [facility], pain in back, now requests epidural   Objective: BP 129/71  Pulse 118  Temp(Src) 97.8 F (36.6 C) (Oral)  Resp 20  Ht 5\' 1"  (1.549 m)  Wt 161 lb (73.029 kg)  BMI 30.44 kg/m2  LMP 07/06/2012     FHT:  FHR: 140 bpm, variability: moderate,  accelerations:  Present,  decelerations:  Present prolonged variable, w spontaneous recovery  UC:   regular, every 3-4  minutes SVE:   Dilation: 1.5 Effacement (%): 80 Station: -3 Exam by:: T. Sprague RN  Pitocin on 3mu   Assessment / Plan: Augmentation of labor, progressing well Prolonged ROM x 16hours   Labor: Progressing normally Preeclampsia:  no s/s Fetal Wellbeing:  Category I Pain Control:  Labor support without medications and Epidural Anticipated MOD:  NSVD  Thrombocytopenia, will recheck platelets now and proceed w epidural if anesthesia ok's  Recheck after comfortable w epidural, consider IUPC  Dr Pennie Rushing updated    [name] 03/29/2013, 10:40 PM

## 2013-03-29 NOTE — Progress Notes (Signed)
  Subjective: Breathing with contractions, but feels some decrease in quality.  Objective: BP 116/51  Pulse 130  Temp(Src) 97.9 F (36.6 C) (Oral)  Resp 18  Ht 5\' 1"  (1.549 m)  Wt 161 lb (73.029 kg)  BMI 30.44 kg/m2  LMP 07/06/2012      FHT: Category 1 UC:   irregular, every 3-6 minutes SVE:    1 cm, 80%, vtx, -1/-2. Cervix feels tight, but patient denies any hx of cervical surgery or manipulation Leaking clear fluid  Assessment / Plan: Protracted latent phase/early labor--likely due to inadequate contractions. Reviewed recommendation for pitocin augmentation--patient agreeable with plan. Declines epidural/IV meds at present--will defer repeat CBC until patient wants to be evaluated for epidural option.  Nigel Bridgeman 03/29/2013, 6:03 PM

## 2013-03-29 NOTE — Progress Notes (Signed)
  Subjective: Walking in room--much more comfortable out of bed.  Breathing with contractions, coping well.  Contractions much stronger and regular than previously.  Doulas on couch.  Objective: BP 109/93  Pulse 92  Temp(Src) 97.8 F (36.6 C) (Oral)  Resp 18  Ht 5\' 1"  (1.549 m)  Wt 161 lb (73.029 kg)  BMI 30.44 kg/m2  LMP 07/06/2012      Filed Vitals:   03/29/13 1236 03/29/13 1316 03/29/13 1337 03/29/13 1513  BP:  128/78 128/78 109/93  Pulse:  88 88 92  Temp: 97 F (36.1 C) 97.8 F (36.6 C) 97.8 F (36.6 C)   TempSrc: Axillary Oral Oral   Resp:  20 18 18   Height:  5\' 1"  (1.549 m) 5\' 1"  (1.549 m)   Weight:  161 lb (73.029 kg) 161 lb (73.029 kg)      FHT:  Initial segment of current tracing had decreased variability--now with moderate variability, 15 beat accel noted. Negative spontaneous CST. UC:   regular, every 2 minutes, moderate/strong quality.   Leaking clear fluid  Assessment / Plan: Early labor Thrombocytopenia SROM at 6:30a GBS negative  Plan: Reviewed platelet count with patient, and advised her Anesthesia would need to determine her candidacy for epidural. Reviewed plan of care to continue observation at present, since contractions have increased in quality and pattern. Consulted with Anesthesia, Dr. Franklyn Lor would consider doing epidural if platelet count remains stable.   Discussed this with patient--advised her to keep me apprised of her decisions regarding pain med.  If she wishes to be evaluated for epidural, will check cervix and draw stat CBC. Advised patient IV pain meds are available as needed. Plan cervical exam if patient wants pain meds, or will check by 6pm if no other reason for exam prior to then.  Nigel Bridgeman 03/29/2013, 3:42 PM

## 2013-03-30 ENCOUNTER — Encounter (HOSPITAL_COMMUNITY)
Admission: AD | Disposition: A | Discharge status: Home / Self Care | Payer: Self-pay | Source: Ambulatory Visit | Attending: Obstetrics and Gynecology

## 2013-03-30 ENCOUNTER — Encounter (HOSPITAL_COMMUNITY): Payer: Self-pay | Admitting: Family Medicine

## 2013-03-30 DIAGNOSIS — Z302 Encounter for sterilization: Secondary | ICD-10-CM

## 2013-03-30 HISTORY — DX: Encounter for sterilization: Z30.2

## 2013-03-30 HISTORY — PX: BILATERAL SALPINGECTOMY: SHX5743

## 2013-03-30 LAB — CBC
MCHC: 34.7 g/dL (ref 30.0–36.0)
RDW: 12.6 % (ref 11.5–15.5)
WBC: 12.1 10*3/uL — ABNORMAL HIGH (ref 4.0–10.5)

## 2013-03-30 SURGERY — SALPINGECTOMY, BILATERAL, OPEN
Anesthesia: Epidural | Site: Abdomen | Wound class: Clean Contaminated

## 2013-03-30 MED ORDER — NALBUPHINE SYRINGE 5 MG/0.5 ML
5.0000 mg | INJECTION | INTRAMUSCULAR | Status: DC | PRN
  Filled 2013-03-30: qty 1

## 2013-03-30 MED ORDER — ONDANSETRON HCL 4 MG/2ML IJ SOLN: 4.0000 mg | Freq: Three times a day (TID) | INTRAMUSCULAR | Status: DC | PRN

## 2013-03-30 MED ORDER — KETOROLAC TROMETHAMINE 30 MG/ML IJ SOLN: 30.0000 mg | Freq: Four times a day (QID) | INTRAMUSCULAR | Status: DC | PRN

## 2013-03-30 MED ORDER — NALOXONE HCL 0.4 MG/ML IJ SOLN: 0.4000 mg | INTRAMUSCULAR | Status: DC | PRN

## 2013-03-30 MED ORDER — MORPHINE SULFATE (PF) 0.5 MG/ML IJ SOLN
INTRAMUSCULAR | Status: DC | PRN
  Administered 2013-03-30: 1 mg via EPIDURAL

## 2013-03-30 MED ORDER — OXYTOCIN 10 UNIT/ML IJ SOLN
INTRAMUSCULAR | Status: AC
  Filled 2013-03-30: qty 3

## 2013-03-30 MED ORDER — OXYTOCIN 40 UNITS IN LACTATED RINGERS INFUSION - SIMPLE MED: 62.5000 mL/h | INTRAVENOUS | Status: AC

## 2013-03-30 MED ORDER — LACTATED RINGERS IV SOLN
INTRAVENOUS | Status: DC
  Administered 2013-03-30: 250 mL/h via INTRAUTERINE
  Administered 2013-03-30: 125 mL/h via INTRAUTERINE

## 2013-03-30 MED ORDER — MENTHOL 3 MG MT LOZG: 1.0000 | LOZENGE | OROMUCOSAL | Status: DC | PRN

## 2013-03-30 MED ORDER — DIPHENHYDRAMINE HCL 25 MG PO CAPS
25.0000 mg | ORAL_CAPSULE | ORAL | Status: DC | PRN
  Filled 2013-03-30: qty 1

## 2013-03-30 MED ORDER — SODIUM BICARBONATE 8.4 % IV SOLN
INTRAVENOUS | Status: DC | PRN
  Administered 2013-03-30 (×2): 5 mL via EPIDURAL

## 2013-03-30 MED ORDER — MEPERIDINE HCL 25 MG/ML IJ SOLN: 6.2500 mg | INTRAMUSCULAR | Status: DC | PRN

## 2013-03-30 MED ORDER — LACTATED RINGERS IV SOLN
INTRAVENOUS | Status: DC | PRN
  Administered 2013-03-30: 05:00:00 via INTRAVENOUS

## 2013-03-30 MED ORDER — DEXTROSE 5 % IV SOLN
2.0000 g | INTRAVENOUS | Status: DC | PRN
  Administered 2013-03-30: 2 g via INTRAVENOUS

## 2013-03-30 MED ORDER — PRENATAL MULTIVITAMIN CH
1.0000 | ORAL_TABLET | Freq: Every day | ORAL | Status: DC
  Administered 2013-03-30 – 2013-04-02 (×3): 1 via ORAL
  Filled 2013-03-30 (×3): qty 1

## 2013-03-30 MED ORDER — OXYCODONE-ACETAMINOPHEN 5-325 MG PO TABS
1.0000 | ORAL_TABLET | ORAL | Status: DC | PRN
  Administered 2013-03-30: 1 via ORAL
  Administered 2013-03-31: 2 via ORAL
  Administered 2013-03-31 (×2): 1 via ORAL
  Administered 2013-03-31: 2 via ORAL
  Administered 2013-04-02 (×2): 1 via ORAL
  Filled 2013-03-30: qty 2
  Filled 2013-03-30 (×7): qty 1

## 2013-03-30 MED ORDER — DIPHENHYDRAMINE HCL 50 MG/ML IJ SOLN: 25.0000 mg | INTRAMUSCULAR | Status: DC | PRN

## 2013-03-30 MED ORDER — DIBUCAINE 1 % RE OINT: 1.0000 "application " | TOPICAL_OINTMENT | RECTAL | Status: DC | PRN

## 2013-03-30 MED ORDER — KETOROLAC TROMETHAMINE 30 MG/ML IJ SOLN: 30.0000 mg | Freq: Four times a day (QID) | INTRAMUSCULAR | Status: AC | PRN

## 2013-03-30 MED ORDER — KETOROLAC TROMETHAMINE 60 MG/2ML IM SOLN: 60.0000 mg | Freq: Once | INTRAMUSCULAR | Status: DC | PRN

## 2013-03-30 MED ORDER — METOCLOPRAMIDE HCL 5 MG/ML IJ SOLN: 10.0000 mg | Freq: Three times a day (TID) | INTRAMUSCULAR | Status: DC | PRN

## 2013-03-30 MED ORDER — 0.9 % SODIUM CHLORIDE (POUR BTL) OPTIME
TOPICAL | Status: DC | PRN
  Administered 2013-03-30: 400 mL

## 2013-03-30 MED ORDER — WITCH HAZEL-GLYCERIN EX PADS: 1.0000 "application " | MEDICATED_PAD | CUTANEOUS | Status: DC | PRN

## 2013-03-30 MED ORDER — SODIUM CHLORIDE 0.9 % IJ SOLN: 3.0000 mL | INTRAMUSCULAR | Status: DC | PRN

## 2013-03-30 MED ORDER — SCOPOLAMINE 1 MG/3DAYS TD PT72
MEDICATED_PATCH | TRANSDERMAL | Status: AC
  Administered 2013-03-30: 1.5 mg via TRANSDERMAL
  Filled 2013-03-30: qty 1

## 2013-03-30 MED ORDER — MORPHINE SULFATE (PF) 0.5 MG/ML IJ SOLN
INTRAMUSCULAR | Status: DC | PRN
  Administered 2013-03-30: 4 mg via EPIDURAL

## 2013-03-30 MED ORDER — DIPHENHYDRAMINE HCL 50 MG/ML IJ SOLN: 12.5000 mg | INTRAMUSCULAR | Status: DC | PRN

## 2013-03-30 MED ORDER — NALBUPHINE HCL 10 MG/ML IJ SOLN
5.0000 mg | INTRAMUSCULAR | Status: DC | PRN
  Filled 2013-03-30: qty 1

## 2013-03-30 MED ORDER — ONDANSETRON HCL 4 MG/2ML IJ SOLN
INTRAMUSCULAR | Status: AC
  Filled 2013-03-30: qty 2

## 2013-03-30 MED ORDER — ZOLPIDEM TARTRATE 5 MG PO TABS: 5.0000 mg | ORAL_TABLET | Freq: Every evening | ORAL | Status: DC | PRN

## 2013-03-30 MED ORDER — ALBUTEROL SULFATE HFA 108 (90 BASE) MCG/ACT IN AERS
2.0000 | INHALATION_SPRAY | Freq: Four times a day (QID) | RESPIRATORY_TRACT | Status: DC | PRN
  Filled 2013-03-30: qty 6.7

## 2013-03-30 MED ORDER — ONDANSETRON HCL 4 MG/2ML IJ SOLN
INTRAMUSCULAR | Status: DC | PRN
  Administered 2013-03-30: 4 mg via INTRAVENOUS

## 2013-03-30 MED ORDER — LACTATED RINGERS IV SOLN
INTRAVENOUS | Status: DC
  Administered 2013-03-30: 13:00:00 via INTRAVENOUS

## 2013-03-30 MED ORDER — PROMETHAZINE HCL 25 MG/ML IJ SOLN: 6.2500 mg | INTRAMUSCULAR | Status: DC | PRN

## 2013-03-30 MED ORDER — SENNOSIDES-DOCUSATE SODIUM 8.6-50 MG PO TABS
2.0000 | ORAL_TABLET | Freq: Every day | ORAL | Status: DC
  Administered 2013-03-30 – 2013-04-01 (×3): 2 via ORAL

## 2013-03-30 MED ORDER — MEPERIDINE HCL 25 MG/ML IJ SOLN
INTRAMUSCULAR | Status: DC | PRN
  Administered 2013-03-30 (×2): 12.5 mg via INTRAVENOUS

## 2013-03-30 MED ORDER — DEXTROSE 5 % IV SOLN
1.0000 ug/kg/h | INTRAVENOUS | Status: DC | PRN
  Filled 2013-03-30: qty 2

## 2013-03-30 MED ORDER — ONDANSETRON HCL 4 MG/2ML IJ SOLN: 4.0000 mg | INTRAMUSCULAR | Status: DC | PRN

## 2013-03-30 MED ORDER — IBUPROFEN 600 MG PO TABS
600.0000 mg | ORAL_TABLET | Freq: Four times a day (QID) | ORAL | Status: DC
  Administered 2013-03-31 – 2013-04-02 (×7): 600 mg via ORAL
  Filled 2013-03-30 (×8): qty 1

## 2013-03-30 MED ORDER — LIDOCAINE-EPINEPHRINE (PF) 2 %-1:200000 IJ SOLN
INTRAMUSCULAR | Status: AC
  Filled 2013-03-30: qty 20

## 2013-03-30 MED ORDER — ONDANSETRON HCL 4 MG PO TABS: 4.0000 mg | ORAL_TABLET | ORAL | Status: DC | PRN

## 2013-03-30 MED ORDER — SODIUM BICARBONATE 8.4 % IV SOLN
INTRAVENOUS | Status: AC
  Filled 2013-03-30: qty 50

## 2013-03-30 MED ORDER — LANOLIN HYDROUS EX OINT: 1.0000 "application " | TOPICAL_OINTMENT | CUTANEOUS | Status: DC | PRN

## 2013-03-30 MED ORDER — SCOPOLAMINE 1 MG/3DAYS TD PT72
1.0000 | MEDICATED_PATCH | Freq: Once | TRANSDERMAL | Status: DC
  Filled 2013-03-30: qty 1

## 2013-03-30 MED ORDER — TETANUS-DIPHTH-ACELL PERTUSSIS 5-2.5-18.5 LF-MCG/0.5 IM SUSP: 0.5000 mL | Freq: Once | INTRAMUSCULAR | Status: DC

## 2013-03-30 MED ORDER — NALOXONE HCL 1 MG/ML IJ SOLN
1.0000 ug/kg/h | INTRAVENOUS | Status: DC | PRN
  Filled 2013-03-30: qty 2

## 2013-03-30 MED ORDER — BUPIVACAINE HCL (PF) 0.25 % IJ SOLN
INTRAMUSCULAR | Status: AC
  Filled 2013-03-30: qty 30

## 2013-03-30 MED ORDER — MEPERIDINE HCL 25 MG/ML IJ SOLN
INTRAMUSCULAR | Status: AC
  Filled 2013-03-30: qty 1

## 2013-03-30 MED ORDER — OXYTOCIN 10 UNIT/ML IJ SOLN
40.0000 [IU] | INTRAVENOUS | Status: DC | PRN
  Administered 2013-03-30: 40 [IU] via INTRAVENOUS

## 2013-03-30 MED ORDER — SIMETHICONE 80 MG PO CHEW
80.0000 mg | CHEWABLE_TABLET | Freq: Three times a day (TID) | ORAL | Status: DC
  Administered 2013-03-30 – 2013-04-02 (×12): 80 mg via ORAL

## 2013-03-30 MED ORDER — DIPHENHYDRAMINE HCL 25 MG PO CAPS: 25.0000 mg | ORAL_CAPSULE | Freq: Four times a day (QID) | ORAL | Status: DC | PRN

## 2013-03-30 MED ORDER — SCOPOLAMINE 1 MG/3DAYS TD PT72
1.0000 | MEDICATED_PATCH | Freq: Once | TRANSDERMAL | Status: DC
  Administered 2013-03-30: 1.5 mg via TRANSDERMAL

## 2013-03-30 MED ORDER — BUPIVACAINE HCL (PF) 0.25 % IJ SOLN
INTRAMUSCULAR | Status: DC | PRN
  Administered 2013-03-30: 20 mL
  Administered 2013-03-30: 10 mL

## 2013-03-30 MED ORDER — DIPHENHYDRAMINE HCL 25 MG PO CAPS: 25.0000 mg | ORAL_CAPSULE | ORAL | Status: DC | PRN

## 2013-03-30 MED ORDER — HYDROMORPHONE HCL PF 1 MG/ML IJ SOLN: 0.2500 mg | INTRAMUSCULAR | Status: DC | PRN

## 2013-03-30 MED ORDER — PHENYLEPHRINE HCL 10 MG/ML IJ SOLN
INTRAMUSCULAR | Status: DC | PRN
  Administered 2013-03-30 (×4): 80 ug via INTRAVENOUS

## 2013-03-30 MED ORDER — MORPHINE SULFATE 0.5 MG/ML IJ SOLN
INTRAMUSCULAR | Status: AC
  Filled 2013-03-30: qty 10

## 2013-03-30 MED ORDER — KETOROLAC TROMETHAMINE 30 MG/ML IJ SOLN: 15.0000 mg | Freq: Once | INTRAMUSCULAR | Status: DC | PRN

## 2013-03-30 MED ORDER — SIMETHICONE 80 MG PO CHEW: 80.0000 mg | CHEWABLE_TABLET | ORAL | Status: DC | PRN

## 2013-03-30 SURGICAL SUPPLY — 43 items
BENZOIN TINCTURE PRP APPL 2/3 (GAUZE/BANDAGES/DRESSINGS) ×3 IMPLANT
BLADE EXTENDED COATED 6.5IN (ELECTRODE) IMPLANT
BOOTIES KNEE HIGH SLOAN (MISCELLANEOUS) ×6 IMPLANT
CLAMP CORD UMBIL (MISCELLANEOUS) IMPLANT
CLOTH BEACON ORANGE TIMEOUT ST (SAFETY) ×3 IMPLANT
CONTAINER PREFILL 10% NBF 15ML (MISCELLANEOUS) ×6 IMPLANT
DERMABOND ADVANCED (GAUZE/BANDAGES/DRESSINGS)
DERMABOND ADVANCED .7 DNX12 (GAUZE/BANDAGES/DRESSINGS) IMPLANT
DRAIN JACKSON PRT FLT 7MM (DRAIN) IMPLANT
DRAPE LG THREE QUARTER DISP (DRAPES) ×3 IMPLANT
DRSG OPSITE POSTOP 4X10 (GAUZE/BANDAGES/DRESSINGS) ×3 IMPLANT
DURAPREP 26ML APPLICATOR (WOUND CARE) ×3 IMPLANT
ELECT CAUTERY BLADE 6.4 (BLADE) IMPLANT
ELECT REM PT RETURN 9FT ADLT (ELECTROSURGICAL) ×3
ELECTRODE REM PT RTRN 9FT ADLT (ELECTROSURGICAL) ×2 IMPLANT
EVACUATOR SILICONE 100CC (DRAIN) IMPLANT
EXTRACTOR VACUUM KIWI (MISCELLANEOUS) IMPLANT
EXTRACTOR VACUUM M CUP 4 TUBE (SUCTIONS) IMPLANT
GLOVE SURG SS PI 6.5 STRL IVOR (GLOVE) ×6 IMPLANT
GOWN PREVENTION PLUS XLARGE (GOWN DISPOSABLE) ×3 IMPLANT
GOWN STRL REIN XL XLG (GOWN DISPOSABLE) ×3 IMPLANT
KIT ABG SYR 3ML LUER SLIP (SYRINGE) IMPLANT
NEEDLE HYPO 25X5/8 SAFETYGLIDE (NEEDLE) ×3 IMPLANT
NEEDLE SPNL 22GX3.5 QUINCKE BK (NEEDLE) ×3 IMPLANT
NS IRRIG 1000ML POUR BTL (IV SOLUTION) ×3 IMPLANT
PACK C SECTION WH (CUSTOM PROCEDURE TRAY) ×3 IMPLANT
PAD OB MATERNITY 4.3X12.25 (PERSONAL CARE ITEMS) ×3 IMPLANT
STRIP CLOSURE SKIN 1/4X4 (GAUZE/BANDAGES/DRESSINGS) ×3 IMPLANT
SUT CHROMIC 2 0 SH (SUTURE) ×6 IMPLANT
SUT MNCRL AB 3-0 PS2 27 (SUTURE) ×3 IMPLANT
SUT SILK 0 FSL (SUTURE) IMPLANT
SUT VIC AB 0 CT1 27 (SUTURE) ×2
SUT VIC AB 0 CT1 27XBRD ANBCTR (SUTURE) ×4 IMPLANT
SUT VIC AB 0 CT1 36 (SUTURE) IMPLANT
SUT VIC AB 0 CTXB 36 (SUTURE) IMPLANT
SUT VIC AB 2-0 CT1 27 (SUTURE) ×2
SUT VIC AB 2-0 CT1 TAPERPNT 27 (SUTURE) ×4 IMPLANT
SUT VIC AB 2-0 SH 27 (SUTURE)
SUT VIC AB 2-0 SH 27XBRD (SUTURE) IMPLANT
SYR CONTROL 10ML LL (SYRINGE) ×3 IMPLANT
TOWEL OR 17X24 6PK STRL BLUE (TOWEL DISPOSABLE) ×3 IMPLANT
TRAY FOLEY CATH 14FR (SET/KITS/TRAYS/PACK) ×3 IMPLANT
WATER STERILE IRR 1000ML POUR (IV SOLUTION) ×3 IMPLANT

## 2013-03-30 NOTE — Anesthesia Postprocedure Evaluation (Signed)
  Anesthesia Post Note  Patient: Jamie Melton  Procedure(s) Performed: Procedure(s) (LRB): BILATERAL SALPINGECTOMY (Bilateral) CESAREAN SECTION of baby  boy at 32 APGAR 8/9 (N/A)  Anesthesia type: Epidural  Patient location: PACU  Post pain: Pain level controlled  Post assessment: Post-op Vital signs reviewed  Last Vitals:  Filed Vitals:   03/30/13 0715  BP: 103/70  Pulse: 88  Temp: 36.5 C  Resp: 18    Post vital signs: Reviewed  Level of consciousness: awake  Complications: No apparent anesthesia complications  Low platelets so will recheck cbc later and pull catheter when ok

## 2013-03-30 NOTE — Lactation Note (Signed)
This note was copied from the chart of Boy Nicolena Schurman. Lactation Consultation Note   Initial consult with this mom and baby, now 10 hours post partum. Baby is asleep in crib, but has fed multiple times this morning, and mom denies any questions at this time. Basic teaching on breast feeding done from the baby and me book, and lactatin and community breast feeding services reviewed with mom. Mom knows to call for questions/concerns.  Patient Name: Fujiko Picazo [id number] Date: 03/30/2013 Reason for consult: Initial assessment   Maternal Data Formula Feeding for Exclusion: No Infant to breast within first hour of birth: No Breastfeeding delayed due to:: Maternal status Has patient been taught Hand Expression?: No Does the patient have breastfeeding experience prior to this delivery?: No  Feeding Feeding Type: Breast Milk Length of feed: 45 min  LATCH Score/Interventions Latch: Repeated attempts needed to sustain latch, nipple held in mouth throughout feeding, stimulation needed to elicit sucking reflex. (Football hold)  Audible Swallowing: A few with stimulation Intervention(s): Skin to skin;Alternate breast massage  Type of Nipple: Everted at rest and after stimulation  Comfort (Breast/Nipple): Soft / non-tender     Hold (Positioning): Assistance needed to correctly position infant at breast and maintain latch.  LATCH Score: 7  Lactation Tools Discussed/Used     Consult Status Consult Status: Follow-up Date: 03/31/13 Follow-up type: In-patient    Alfred Levins 03/30/2013, 3:21 PM

## 2013-03-30 NOTE — Progress Notes (Signed)
Patient ID: Jamie Melton, female   DOB: 11/29/1970, 42 y.o.   MRN: [id number] Jamie Melton is a 42 y.o. G2P0010 at [redacted]w[redacted]d admitted for SROM  Subjective: Comfortable now w epidural  Objective: BP 105/67  Pulse 81  Temp(Src) 97.8 F (36.6 C) (Axillary)  Resp 16  Ht 5\' 1"  (1.549 m)  Wt 161 lb (73.029 kg)  BMI 30.44 kg/m2  SpO2 97%  LMP 07/06/2012     FHT:  FHR: 140 bpm, variability: moderate,  accelerations:  Present,  decelerations:  Present variables, after epidural, now have resolved UC:   irregular, every 2-5 minutes SVE:   Dilation: 4 Effacement (%): 100 Station: -1 Exam by:: S. Lashonda Sonneborn CNM  IUPC placed without difficulty   Assessment / Plan: prolonged SROM x18hrs Pitocin augmentation continues   Labor: early labor Preeclampsia:  no s/s Fetal Wellbeing:  Category I Pain Control:  Epidural Anticipated MOD:  NSVD   Titrate pitocin for adequate MVU's  Recheck 2-3hrs  Update physician PRN   Rithvik Orcutt M 03/30/2013, 2:09 AM

## 2013-03-30 NOTE — Progress Notes (Signed)
MD at the bedside and discussed risks and benefits of cesarean section delivery. Pt states understanding. Informed consents signed.

## 2013-03-30 NOTE — Anesthesia Postprocedure Evaluation (Signed)
Anesthesia Post Note  Patient: Jamie Melton  Procedure(s) Performed: Procedure(s) (LRB): BILATERAL SALPINGECTOMY (Bilateral) CESAREAN SECTION of baby  boy at 77 APGAR 8/9 (N/A)  Anesthesia type: Epidural  Patient location: Mother/Baby  Post pain: Pain level controlled  Post assessment: Post-op Vital signs reviewed  Last Vitals:  Filed Vitals:   03/30/13 1336  BP: 98/63  Pulse: 88  Temp:   Resp:     Post vital signs: Reviewed  Level of consciousness:alert  Complications: No apparent anesthesia complications

## 2013-03-30 NOTE — Progress Notes (Signed)
Jamie Melton is a 42 y.o. G2P0010 at [redacted]w[redacted]d by LMP admitted for rupture of membranes  Subjective: Comfortable with epidural in place.  Feels fluid return from amnioinfusion.   Objective: BP 102/59  Pulse 77  Temp(Src) 98.7 F (37.1 C) (Oral)  Resp 18  Ht 5\' 1"  (1.549 m)  Wt 161 lb (73.029 kg)  BMI 30.44 kg/m2  SpO2 100%  LMP 07/06/2012   Total I/O In: -  Out: 600 [Urine:600]  FHT:  FHR: 140s bpm, variability: minimal ,  accelerations: occassional.    Decelerations:  Present.   Late decelerations that were prominent previously have improved. UC:   irregular, every 3-7 minutes SVE:   Dilation: 5 Effacement (%): 100 Station: -2 Exam by:: Dr. Pennie Rushing   Labs: Lab Results  Component Value Date   WBC 12.6* 03/29/2013   HGB 12.1 03/29/2013   HCT 34.8* 03/29/2013   MCV 98.3 03/29/2013   PLT 95* 03/29/2013    Assessment / Plan: FHR changes with late decelerations improved with discontinuation of pitocin, but no progress in cervical dilation with irreg contractions off pitocin  Labor: Arrest of active phase off pitocin Preeclampsia:  no signs or symptoms of toxicity Fetal Wellbeing:  Category II and evidence of fhr intolerance of regular contractions previously on Pitocin. Pain Control:  Epidural I/D:  n/a Anticipated MOD:  Discussed options for management at this time in the context of prior fetal intolerance of regular contractions and lack of cervical change without regular pitocin augmented contractions.  Pt wants to restart Pitocin once more to insure that fetus still will not tolerate it.  She also wants no further pregnancies and requests surgical sterilization.  The risks of cesarean section and tubal ligation have been explained in full.  I reviewed risks of anesthesia, bleeding, infection, and damage to adjacent organs. I reviewed the risk of subsequent pregnancy with tubal sterilization.   She agrees that if late decelerations become persistent with the start of pitocin, she  wants to proceed with cesarean.  Jamie Melton P 03/30/2013, 4:14 AM

## 2013-03-30 NOTE — Progress Notes (Signed)
Patient ID: Jamie Melton, female   DOB: 1971/06/22, 42 y.o.   MRN: [id number] Jamie Melton is a 42 y.o. G2P0010 at [redacted]w[redacted]d admitted for SROM  Subjective: Comfortable w epidural   Objective: BP 106/57  Pulse 80  Temp(Src) 98.7 F (37.1 C) (Oral)  Resp 16  Ht 5\' 1"  (1.549 Melton)  Wt 161 lb (73.029 kg)  BMI 30.44 kg/m2  SpO2 100%  LMP 07/06/2012  Total I/O In: -  Out: 600 [Urine:600]  FHT:  FHR: 140 bpm, variability: moderate,  accelerations:  Present,  decelerations:  Present recurrent late decels UC:   irregular, every 4-7  minutes SVE:   Dilation: 5.5 Effacement (%): 100 Station: -1 Exam by:: S. Abdalrahman Clementson CNM  VE deferred    Assessment / Plan: prolonged SROM, without evidence of chorioamnionitis  GBS neg  Labor: early/active labor, inadequate MVU's Preeclampsia:  no s/s Fetal Wellbeing:  Category II Pain Control:  Epidural Anticipated MOD:  undetermined  FHR tracing improved initially after dc pitocin, however, late decels now reoccurring,   Notified Dr Pennie Rushing, she will view tracing now, and to begin amnioinfusion CTO closely    Jamie Melton 03/30/2013, 3:27 AM

## 2013-03-30 NOTE — Progress Notes (Signed)
Patient ID: Jamie Melton, female   DOB: Feb 07, 1971, 42 y.o.   MRN: [id number] Jamie Melton is a 42 y.o. G2P0010 at [redacted]w[redacted]d admitted for srom  Subjective: Called by RN at 0150, secondary to FHR tracing, pitocin turned off that time, Remains comfortable w epidural   Objective: BP 106/57  Pulse 80  Temp(Src) 98.7 F (37.1 C) (Oral)  Resp 16  Ht 5\' 1"  (1.549 m)  Wt 161 lb (73.029 kg)  BMI 30.44 kg/m2  SpO2 100%  LMP 07/06/2012  Total I/O In: -  Out: 600 [Urine:600]  FHT:  FHR: 140 bpm, variability: moderate,  accelerations:  Present,  decelerations:  Present variable and late decels, improved initially after pitocin turned off, , multiple position changes, IVF bolus and 02 have been used  UC:   irregular, every 5-7 minutes SVE:   Dilation: 5.5 Effacement (%): 100 Station: -1 Exam by:: Jamie Melton CNM    Assessment / Plan: prolonged SROM Recurrent variable late decels, improved since pitocin dc'd,   Labor: early/active labor, inadequate MVU's Preeclampsia:  no s/s Fetal Wellbeing:  Category II Pain Control:  Epidural Anticipated MOD:  undetermined  will leave pitocin off and reevaluate in 1 hour,  Briefly discussed w pt may be necessary for c/s if unable to titrate pitocin adequately or if fetal intolerance to labor  CTO closely  Update physician PRN   Jamie Melton 03/30/2013, 3:18 AM

## 2013-03-30 NOTE — Transfer of Care (Signed)
Immediate Anesthesia Transfer of Care Note  Patient: Jamie Melton  Procedure(s) Performed: Procedure(s): BILATERAL SALPINGECTOMY (Bilateral) CESAREAN SECTION of baby  boy at 1 APGAR 8/9 (N/A)  Patient Location: PACU  Anesthesia Type:Epidural  Level of Consciousness: awake, alert  and oriented  Airway & Oxygen Therapy: Patient Spontanous Breathing  Post-op Assessment: Report given to PACU RN and Post -op Vital signs reviewed and stable  Post vital signs: Reviewed and stable  Complications: No apparent anesthesia complications

## 2013-03-30 NOTE — OR Nursing (Signed)
50 ml blood loss during fundal massage by DLWegner RN, cord blood x 2 to OR front desk 

## 2013-03-30 NOTE — Op Note (Signed)
Cesarean Section and tubal sterilization Procedure Note  Indications: non-reassuring fetal status                      Desire for surgical sterilization  Pre-operative Diagnosis: 38 week 4 day pregnancy.  Post-operative Diagnosis: same  Surgeon: Hal Morales  First Assistant:  Surgeon: Hal Morales   Assistants: Sanda Klein certified nurse midwife  Anesthesia: Epidural anesthesia  ASA Class: 2  Procedure Details  The patient was seen in the Holding Room. The risks, benefits, complications, treatment options, and expected outcomes were discussed with the patient.  The patient concurred with the proposed plan, giving informed consent.  The site of surgery properly noted/marked. The patient was taken to Operating Room # 9, identified as Jamie Melton and the procedure verified as C-Section Delivery. A Time Out was held and the above information confirmed.  After induction of anesthesia, the patient was  prepped with chlor prep in the usual sterile manner.A foley catheter was placed under sterile conditions.  The patient was then draped in the usual fashion.   Suprapubicsubcutaneous injection of 0.25% Bupivacaine   A Pfannenstiel incision was made and carried down through the subcutaneous tissue to the fascia. Fascial incision was made and extended transversely. The fascia was separated from the underlying rectus tissue superiorly and inferiorly. The peritoneum was identified and entered. Peritoneal incision was extended longitudinally.  The bladder blade was placed.   A low transverse uterine incision was made two cm above the uterovesical fold, and that incision extended transversely bluntly.The infant was  delivered from occiput transverse presentation was a living female infant Apgar scores of 8 at one minute and 9 at five minutes. After the umbilical cord was clamped and cut cord blood was obtained for evaluation. The placenta was removed intact and appeared normal. The tubes  and ovaries appeared norma for the gravid state. 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 An imbricating layer of sutures was placed. Hemostasis was observed. Lavage was carried out until clear. The right fallopian tube was elevated into the operative field. It was successively clamped, cut, and suture ligated along the mesosalpinx to cornual region and then excised from the cornual region after suture ligation. A similar procedure was carried out on the opposite side. Hemostasis was noted.   The peritoneum was closed with a running suture of 2-0 Vicryl.  The rectus muscles were reapproximated with a figure of 8 suture of 2-0 Vicryl.  The fascia was then reapproximated with a running sutures of 0 Vicryl .Renforcing figure of 8 sutures of 0Vicryl were placed on either side of midline.   The skin was reapproximated with }3-0 moncryl.  A sterile dressing was applied.  Instrument, sponge, and needle counts were correct prior to the abdominal closure and at the conclusion of the case.   Findings:  Placenta contained a 3 vessel cord    Estimated Blood Loss:  500 cc         Drains: None         Total IV Fluids: 2000 ml         Specimens: Placenta to birthing suite. Right and left fallopian tubes to pathology         Implants: None         Complications: ::"None; patient tolerated the procedure well."         Disposition: PACU -  hemodynamically stable.         Condition: stable  Attending Attestation: I performed the procedure.  HAYGOOD,VANESSA P  03/30/2013 6:26 AM

## 2013-03-31 ENCOUNTER — Encounter (HOSPITAL_COMMUNITY): Payer: Self-pay | Admitting: Obstetrics and Gynecology

## 2013-03-31 LAB — CBC
MCH: 33.4 pg (ref 26.0–34.0)
MCHC: 34.1 g/dL (ref 30.0–36.0)
MCV: 98.1 fL (ref 78.0–100.0)
Platelets: 126 10*3/uL — ABNORMAL LOW (ref 150–400)
RDW: 12.9 % (ref 11.5–15.5)

## 2013-03-31 NOTE — Progress Notes (Addendum)
Subjective: Postpartum Day 1: Cesarean Delivery Patient reports no problems voiding.  Pt reports no flatus and belly is uncomfortable.  Pt is ambulating without difficulty and bleeding in normal.  She plans to BF.  Objective: Vital signs in last 24 hours: Temp:  [97.6 F (36.4 C)-99.1 F (37.3 C)] 97.9 F (36.6 C) (09/08 0600) Pulse Rate:  [71-98] 75 (09/08 0600) Resp:  [16-18] 18 (09/08 0600) BP: (92-126)/(52-79) 114/62 mmHg (09/08 0600) SpO2:  [97 %-100 %] 100 % (09/08 0600)  Physical Exam:  General: alert and no distress Lochia: appropriate Uterine Fundus: firm Abd soflty distended, + discomfort with palpation, decreased BS, no guarding or rebound Incision: healing well, dressing intact and without staining DVT Evaluation: No evidence of DVT seen on physical exam.   Recent Labs  03/29/13 2245 03/30/13 0649  HGB 12.1 11.4*  HCT 34.8* 32.9*   Assessment/Plan: Status post Cesarean section. Postoperative course complicated by mildly distended abdomen, possibly developing early ileus.  Thrombocytopenia - recheck cbc today NPO until + flatus and then advance diet as tolerated Circumcision planned to be done in hosp today  Mykelti Goldenstein Y 03/31/2013, 11:30 AM  Addendum Plts today 126

## 2013-03-31 NOTE — Anesthesia Postprocedure Evaluation (Signed)
  Anesthesia Post-op Note  Patient: Jamie Melton  Procedure(s) Performed: Procedure(s): BILATERAL SALPINGECTOMY (Bilateral) Primary CESAREAN SECTION of baby  boy at 73 APGAR 8/9 (N/A)  Patient Location: Mother/Baby  Anesthesia Type:Epidural  Level of Consciousness: awake  Airway and Oxygen Therapy: Patient Spontanous Breathing  Post-op Pain: none  Post-op Assessment: Patient's Cardiovascular Status Stable, Respiratory Function Stable, Patent Airway, No signs of Nausea or vomiting, Adequate PO intake, Pain level controlled, No headache, No backache, No residual numbness and No residual motor weakness  Post-op Vital Signs: Reviewed and stable  Complications: No apparent anesthesia complications

## 2013-03-31 NOTE — Addendum Note (Signed)
Addendum created 03/31/13 1404 by Suella Grove, CRNA   Modules edited: Anesthesia LDA

## 2013-03-31 NOTE — Progress Notes (Signed)
Ur chart review completed.  

## 2013-04-01 LAB — TYPE AND SCREEN
ABO/RH(D): B POS
Antibody Screen: NEGATIVE
Unit division: 0

## 2013-04-01 MED ORDER — BISACODYL 10 MG RE SUPP
10.0000 mg | Freq: Once | RECTAL | Status: AC
  Administered 2013-04-01: 10 mg via RECTAL
  Filled 2013-04-01: qty 1

## 2013-04-01 NOTE — Progress Notes (Signed)
Subjective: Postpartum Day 2: Cesarean Delivery Patient reports + flatus and no problems voiding.  Pt ambulating well and did not eat yesterday.  She feels much better today.  She wants to start with clears.  She had dulcolax supp this morning and has passed flatus and had a small BM.    Objective: Vital signs in last 24 hours: Temp:  [97.7 F (36.5 C)-98.4 F (36.9 C)] 97.7 F (36.5 C) (09/09 1610) Pulse Rate:  [70-84] 70 (09/09 0614) Resp:  [18] 18 (09/09 0614) BP: (101-102)/(57-64) 102/64 mmHg (09/09 9604)  Physical Exam:  General: alert and no distress Lochia: appropriate Uterine Fundus: firm Incision: healing well, dressing c/d/intact and without staining DVT Evaluation: No evidence of DVT seen on physical exam. Abd less distended today, NABS, soft and app tenderness with palpation   Recent Labs  03/30/13 0649 03/31/13 1153  HGB 11.4* 12.0  HCT 32.9* 35.2*    Assessment/Plan: Status post Cesarean section. Postoperative course complicated by slow recovery of GI fxn.  Will start with clears and advance diet as tolerated.  Continue current care. Anticipate d/c tomorrow  Purcell Nails 04/01/2013, 12:27 PM

## 2013-04-01 NOTE — Progress Notes (Signed)
Saw patient walking in hall--no flatus yet, but feels less distended. No BM yet. Denies N/V. Pain well-controlled with po meds.  Recommended Dulcolax suppository this am. Patient agreeable with plan.  Nigel Bridgeman, CNM 04/01/13 7:15a

## 2013-04-02 MED ORDER — OXYCODONE-ACETAMINOPHEN 5-325 MG PO TABS: 1.0000 | ORAL_TABLET | Freq: Four times a day (QID) | ORAL | Status: DC | PRN

## 2013-04-02 MED ORDER — IBUPROFEN 600 MG PO TABS: 600.0000 mg | ORAL_TABLET | Freq: Four times a day (QID) | ORAL | Status: DC | PRN

## 2013-04-02 NOTE — Discharge Summary (Signed)
Obstetric Discharge Summary Reason for Admission: rupture of membranes and in early labor Prenatal Procedures: ultrasound Intrapartum Procedures: LTCS and BTL for fetal intolerance Postpartum Procedures: Slow return of GI fxn improved at time of discharge.  PO intake held for 24hrs and pt improved. Complications-Operative and Postpartum: none Hemoglobin  Date Value Range Status  03/31/2013 12.0  12.0 - 15.0 g/dL Final     HCT  Date Value Range Status  03/31/2013 35.2* 36.0 - 46.0 % Final   Pt says she feels much better today.  No complaints and anticipating discharge.  She has been passing flatus all of yest afternoon, evening and this morning and s/p BM yest as well.  She is BF.  Reports no N/V and tolerating po without any of the abdominal discomfort she initially had.  She had thrombocytopenia down to 80 during admission but last plt count was 126.  Physical Exam:  General: alert and no distress Lochia: appropriate Uterine Fundus: firm Incision: healing well, dressing c/d/i Abd softly distended (continues to improve), NABA DVT Evaluation: No evidence of DVT seen on physical exam.  Discharge Diagnoses: Term Pregnancy-delivered  Discharge Information: Date: 04/02/2013 Activity: pelvic rest Diet: routine Medications: Ibuprofen and Percocet Condition: stable Instructions: refer to practice specific booklet Discharge to: home   Newborn Data: Live born female  Birth Weight: 6 lb 12.3 oz (3070 g) APGAR: 8, 9  Home with mother.  Jamie Melton Y 04/02/2013, 9:43 AM

## 2013-05-29 ENCOUNTER — Other Ambulatory Visit: Payer: Self-pay

## 2014-05-04 ENCOUNTER — Other Ambulatory Visit: Payer: Self-pay | Admitting: Obstetrics and Gynecology

## 2014-05-04 DIAGNOSIS — Z1231 Encounter for screening mammogram for malignant neoplasm of breast: Secondary | ICD-10-CM

## 2014-05-25 ENCOUNTER — Encounter (HOSPITAL_COMMUNITY): Payer: Self-pay | Admitting: Obstetrics and Gynecology

## 2014-06-01 ENCOUNTER — Ambulatory Visit
Admission: RE | Admit: 2014-06-01 | Discharge: 2014-06-01 | Disposition: A | Discharge status: Home / Self Care | Payer: 59 | Source: Ambulatory Visit | Attending: Obstetrics and Gynecology | Admitting: Obstetrics and Gynecology

## 2014-06-01 DIAGNOSIS — Z1231 Encounter for screening mammogram for malignant neoplasm of breast: Secondary | ICD-10-CM

## 2014-07-13 ENCOUNTER — Encounter: Payer: Self-pay | Admitting: Cardiovascular Disease

## 2015-06-03 ENCOUNTER — Other Ambulatory Visit: Payer: Self-pay

## 2015-06-03 DIAGNOSIS — Z1231 Encounter for screening mammogram for malignant neoplasm of breast: Secondary | ICD-10-CM

## 2015-07-08 ENCOUNTER — Ambulatory Visit: Payer: 59

## 2015-07-21 ENCOUNTER — Ambulatory Visit
Admission: RE | Admit: 2015-07-21 | Discharge: 2015-07-21 | Disposition: A | Discharge status: Home / Self Care | Payer: 59 | Source: Ambulatory Visit

## 2015-07-21 DIAGNOSIS — Z1231 Encounter for screening mammogram for malignant neoplasm of breast: Secondary | ICD-10-CM

## 2015-07-23 ENCOUNTER — Other Ambulatory Visit: Payer: Self-pay | Admitting: Obstetrics and Gynecology

## 2015-07-23 DIAGNOSIS — R928 Other abnormal and inconclusive findings on diagnostic imaging of breast: Secondary | ICD-10-CM

## 2015-07-29 ENCOUNTER — Ambulatory Visit
Admission: RE | Admit: 2015-07-29 | Discharge: 2015-07-29 | Disposition: A | Discharge status: Home / Self Care | Payer: 59 | Source: Ambulatory Visit | Attending: Obstetrics and Gynecology | Admitting: Obstetrics and Gynecology

## 2015-07-29 DIAGNOSIS — R928 Other abnormal and inconclusive findings on diagnostic imaging of breast: Secondary | ICD-10-CM

## 2015-07-29 DIAGNOSIS — R922 Inconclusive mammogram: Secondary | ICD-10-CM | POA: Diagnosis not present

## 2015-08-03 ENCOUNTER — Telehealth: Payer: 59 | Admitting: Nurse Practitioner

## 2015-08-03 DIAGNOSIS — J0100 Acute maxillary sinusitis, unspecified: Secondary | ICD-10-CM | POA: Diagnosis not present

## 2015-08-03 MED ORDER — AMOXICILLIN-POT CLAVULANATE 875-125 MG PO TABS: 1.0000 | ORAL_TABLET | Freq: Two times a day (BID) | ORAL | Status: DC

## 2015-08-03 NOTE — Progress Notes (Signed)

## 2015-08-04 MED FILL — AMOX TR-K CLV 875-125 MG TA: 875-125 | 10 days supply | Qty: 20 | Fill #0

## 2015-08-16 ENCOUNTER — Ambulatory Visit: Payer: 59

## 2015-11-26 DIAGNOSIS — R05 Cough: Secondary | ICD-10-CM | POA: Diagnosis not present

## 2015-11-26 DIAGNOSIS — J4530 Mild persistent asthma, uncomplicated: Secondary | ICD-10-CM | POA: Diagnosis not present

## 2015-11-26 DIAGNOSIS — J3089 Other allergic rhinitis: Secondary | ICD-10-CM | POA: Diagnosis not present

## 2015-11-26 DIAGNOSIS — H1045 Other chronic allergic conjunctivitis: Secondary | ICD-10-CM | POA: Diagnosis not present

## 2015-11-26 MED FILL — hydrOXYzine HCL 10 MG TABS: 10 | 30 days supply | Qty: 120 | Fill #0

## 2015-11-26 MED FILL — PROAIR RESPICLICK INHAL PWD: 108 (90 BAS | 25 days supply | Qty: 1 | Fill #0

## 2015-11-26 MED FILL — ARNUITY ELLIPTA 100 MCG INH: 100 | 30 days supply | Qty: 30 | Fill #0

## 2015-11-26 MED FILL — LEVOCETIRIZINE 5 MG TABLET: 5 | 30 days supply | Qty: 30 | Fill #0

## 2016-01-03 MED FILL — LEVOCETIRIZINE 5 MG TABLET: 5 | 30 days supply | Qty: 30 | Fill #1

## 2016-01-13 DIAGNOSIS — H524 Presbyopia: Secondary | ICD-10-CM | POA: Diagnosis not present

## 2016-02-11 MED FILL — LEVOCETIRIZINE 5 MG TABLET: 5 | 30 days supply | Qty: 30 | Fill #2

## 2016-02-11 MED FILL — ARNUITY ELLIPTA 100 MCG INH: 100 | 30 days supply | Qty: 30 | Fill #1

## 2016-02-11 MED FILL — hydrOXYzine HCL 10 MG TABS: 10 | 30 days supply | Qty: 120 | Fill #1

## 2016-03-16 ENCOUNTER — Emergency Department (HOSPITAL_COMMUNITY)
Admission: EM | Admit: 2016-03-16 | Discharge: 2016-03-16 | Disposition: A | Discharge status: Home / Self Care | Payer: No Typology Code available for payment source | Attending: Emergency Medicine | Admitting: Emergency Medicine

## 2016-03-16 ENCOUNTER — Emergency Department (HOSPITAL_COMMUNITY): Payer: No Typology Code available for payment source

## 2016-03-16 ENCOUNTER — Encounter (HOSPITAL_COMMUNITY): Payer: Self-pay

## 2016-03-16 DIAGNOSIS — Z79899 Other long term (current) drug therapy: Secondary | ICD-10-CM | POA: Insufficient documentation

## 2016-03-16 DIAGNOSIS — S161XXA Strain of muscle, fascia and tendon at neck level, initial encounter: Secondary | ICD-10-CM | POA: Diagnosis not present

## 2016-03-16 DIAGNOSIS — S199XXA Unspecified injury of neck, initial encounter: Secondary | ICD-10-CM | POA: Diagnosis present

## 2016-03-16 DIAGNOSIS — Y999 Unspecified external cause status: Secondary | ICD-10-CM | POA: Diagnosis not present

## 2016-03-16 DIAGNOSIS — S0990XA Unspecified injury of head, initial encounter: Secondary | ICD-10-CM | POA: Insufficient documentation

## 2016-03-16 DIAGNOSIS — M549 Dorsalgia, unspecified: Secondary | ICD-10-CM | POA: Insufficient documentation

## 2016-03-16 DIAGNOSIS — Y9389 Activity, other specified: Secondary | ICD-10-CM | POA: Insufficient documentation

## 2016-03-16 DIAGNOSIS — Y92410 Unspecified street and highway as the place of occurrence of the external cause: Secondary | ICD-10-CM | POA: Diagnosis not present

## 2016-03-16 DIAGNOSIS — J45909 Unspecified asthma, uncomplicated: Secondary | ICD-10-CM | POA: Insufficient documentation

## 2016-03-16 DIAGNOSIS — R079 Chest pain, unspecified: Secondary | ICD-10-CM | POA: Diagnosis not present

## 2016-03-16 LAB — BASIC METABOLIC PANEL
ANION GAP: 6 (ref 5–15)
BUN: 11 mg/dL (ref 6–20)
CO2: 28 mmol/L (ref 22–32)
Calcium: 9.3 mg/dL (ref 8.9–10.3)
Chloride: 105 mmol/L (ref 101–111)
Creatinine, Ser: 0.8 mg/dL (ref 0.44–1.00)
GFR calc Af Amer: >60 mL/min (ref >60)
Glucose, Bld: 100 mg/dL — ABNORMAL HIGH (ref 65–99)
POTASSIUM: 3.9 mmol/L (ref 3.5–5.1)
SODIUM: 139 mmol/L (ref 135–145)

## 2016-03-16 LAB — CBC
HCT: 39.2 % (ref 36.0–46.0)
Hemoglobin: 13.1 g/dL (ref 12.0–15.0)
MCH: 32.1 pg (ref 26.0–34.0)
MCHC: 33.4 g/dL (ref 30.0–36.0)
MCV: 96.1 fL (ref 78.0–100.0)
PLATELETS: 184 10*3/uL (ref 150–400)
RBC: 4.08 MIL/uL (ref 3.87–5.11)
RDW: 12.1 % (ref 11.5–15.5)
WBC: 5.3 10*3/uL (ref 4.0–10.5)

## 2016-03-16 LAB — CBG MONITORING, ED: Glucose-Capillary: 131 mg/dL — ABNORMAL HIGH (ref 65–99)

## 2016-03-16 MED ORDER — HYDROCODONE-ACETAMINOPHEN 5-325 MG PO TABS: 2.0000 | ORAL_TABLET | Freq: Four times a day (QID) | ORAL | 0 refills | Status: DC | PRN

## 2016-03-16 MED ORDER — CYCLOBENZAPRINE HCL 10 MG PO TABS: 10.0000 mg | ORAL_TABLET | Freq: Two times a day (BID) | ORAL | 0 refills | Status: DC | PRN

## 2016-03-16 MED FILL — HYDROCODON-APAP 5-325: 5-325 | 3 days supply | Qty: 10 | Fill #0

## 2016-03-16 MED FILL — CYCLOBENZAPRINE 10 MG TAB: 10 | 10 days supply | Qty: 20 | Fill #0
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
## 2016-03-16 NOTE — ED Notes (Signed)
Bed: [facility] Expected date:  Expected time:  Means of arrival:  Comments: 

## 2016-03-24 MED FILL — LEVOCETIRIZINE 5 MG TABLET: 5 | 30 days supply | Qty: 30 | Fill #3

## 2016-05-11 MED FILL — hydrOXYzine HCL 10 MG TABS: 10 | 30 days supply | Qty: 120 | Fill #2

## 2016-05-29 DIAGNOSIS — L299 Pruritus, unspecified: Secondary | ICD-10-CM | POA: Diagnosis not present

## 2016-05-29 DIAGNOSIS — J4530 Mild persistent asthma, uncomplicated: Secondary | ICD-10-CM | POA: Diagnosis not present

## 2016-05-29 DIAGNOSIS — J3089 Other allergic rhinitis: Secondary | ICD-10-CM | POA: Diagnosis not present

## 2016-05-29 DIAGNOSIS — H1045 Other chronic allergic conjunctivitis: Secondary | ICD-10-CM | POA: Diagnosis not present

## 2016-05-30 ENCOUNTER — Other Ambulatory Visit: Payer: Self-pay | Admitting: Obstetrics and Gynecology

## 2016-05-30 DIAGNOSIS — N644 Mastodynia: Secondary | ICD-10-CM

## 2016-06-22 DIAGNOSIS — B373 Candidiasis of vulva and vagina: Secondary | ICD-10-CM | POA: Diagnosis not present

## 2016-06-22 DIAGNOSIS — Z01411 Encounter for gynecological examination (general) (routine) with abnormal findings: Secondary | ICD-10-CM | POA: Diagnosis not present

## 2016-06-22 DIAGNOSIS — Z124 Encounter for screening for malignant neoplasm of cervix: Secondary | ICD-10-CM | POA: Diagnosis not present

## 2016-06-22 DIAGNOSIS — N644 Mastodynia: Secondary | ICD-10-CM | POA: Diagnosis not present

## 2016-06-22 DIAGNOSIS — Z6827 Body mass index (BMI) 27.0-27.9, adult: Secondary | ICD-10-CM | POA: Diagnosis not present

## 2016-06-23 ENCOUNTER — Other Ambulatory Visit: Payer: Self-pay | Admitting: Obstetrics and Gynecology

## 2016-06-23 DIAGNOSIS — N644 Mastodynia: Secondary | ICD-10-CM

## 2016-06-30 ENCOUNTER — Other Ambulatory Visit: Payer: 59

## 2016-07-21 ENCOUNTER — Ambulatory Visit
Admission: RE | Admit: 2016-07-21 | Discharge: 2016-07-21 | Disposition: A | Discharge status: Home / Self Care | Payer: 59 | Source: Ambulatory Visit | Attending: Obstetrics and Gynecology | Admitting: Obstetrics and Gynecology

## 2016-07-21 DIAGNOSIS — N644 Mastodynia: Secondary | ICD-10-CM

## 2016-07-21 DIAGNOSIS — R922 Inconclusive mammogram: Secondary | ICD-10-CM | POA: Diagnosis not present

## 2016-07-28 MED FILL — hydrOXYzine HCL 10 MG TABS: 10 | 30 days supply | Qty: 120 | Fill #3

## 2016-08-21 MED FILL — LEVOCETIRIZINE 5 MG TABLET: 5 | 30 days supply | Qty: 30 | Fill #0

## 2016-09-28 DIAGNOSIS — J45901 Unspecified asthma with (acute) exacerbation: Secondary | ICD-10-CM | POA: Diagnosis not present

## 2016-09-28 DIAGNOSIS — M2550 Pain in unspecified joint: Secondary | ICD-10-CM | POA: Diagnosis not present

## 2016-09-28 DIAGNOSIS — J309 Allergic rhinitis, unspecified: Secondary | ICD-10-CM | POA: Diagnosis not present

## 2016-09-28 DIAGNOSIS — E559 Vitamin D deficiency, unspecified: Secondary | ICD-10-CM | POA: Diagnosis not present

## 2016-09-29 MED FILL — MELOXICAM 7.5 MG TABLET: 7.5 | 30 days supply | Qty: 30 | Fill #0

## 2016-10-30 DIAGNOSIS — E559 Vitamin D deficiency, unspecified: Secondary | ICD-10-CM | POA: Diagnosis not present

## 2016-10-30 DIAGNOSIS — Z1231 Encounter for screening mammogram for malignant neoplasm of breast: Secondary | ICD-10-CM | POA: Diagnosis not present

## 2016-10-30 DIAGNOSIS — J309 Allergic rhinitis, unspecified: Secondary | ICD-10-CM | POA: Diagnosis not present

## 2016-10-30 DIAGNOSIS — Z0000 Encounter for general adult medical examination without abnormal findings: Secondary | ICD-10-CM | POA: Diagnosis not present

## 2016-10-30 DIAGNOSIS — J4521 Mild intermittent asthma with (acute) exacerbation: Secondary | ICD-10-CM | POA: Diagnosis not present

## 2016-10-30 DIAGNOSIS — E041 Nontoxic single thyroid nodule: Secondary | ICD-10-CM | POA: Diagnosis not present

## 2016-10-30 DIAGNOSIS — M130 Polyarthritis, unspecified: Secondary | ICD-10-CM | POA: Diagnosis not present

## 2016-12-04 DIAGNOSIS — M25551 Pain in right hip: Secondary | ICD-10-CM | POA: Diagnosis not present

## 2016-12-04 DIAGNOSIS — M79643 Pain in unspecified hand: Secondary | ICD-10-CM | POA: Diagnosis not present

## 2016-12-04 DIAGNOSIS — M2550 Pain in unspecified joint: Secondary | ICD-10-CM | POA: Diagnosis not present

## 2016-12-04 DIAGNOSIS — M545 Low back pain: Secondary | ICD-10-CM | POA: Diagnosis not present

## 2016-12-04 DIAGNOSIS — M25552 Pain in left hip: Secondary | ICD-10-CM | POA: Diagnosis not present

## 2016-12-04 DIAGNOSIS — M791 Myalgia: Secondary | ICD-10-CM | POA: Diagnosis not present

## 2016-12-04 DIAGNOSIS — M549 Dorsalgia, unspecified: Secondary | ICD-10-CM | POA: Diagnosis not present

## 2016-12-04 MED FILL — hydrOXYzine HCL 10 MG TABS: 10 | 30 days supply | Qty: 120 | Fill #0

## 2016-12-19 ENCOUNTER — Other Ambulatory Visit (HOSPITAL_COMMUNITY): Payer: Self-pay | Admitting: Rheumatology

## 2016-12-19 DIAGNOSIS — M545 Low back pain: Secondary | ICD-10-CM

## 2016-12-25 ENCOUNTER — Ambulatory Visit (HOSPITAL_COMMUNITY)
Admission: RE | Admit: 2016-12-25 | Discharge: 2016-12-25 | Disposition: A | Discharge status: Home / Self Care | Payer: 59 | Source: Ambulatory Visit | Attending: Rheumatology | Admitting: Rheumatology

## 2016-12-25 DIAGNOSIS — M461 Sacroiliitis, not elsewhere classified: Secondary | ICD-10-CM | POA: Insufficient documentation

## 2016-12-25 DIAGNOSIS — M545 Low back pain: Secondary | ICD-10-CM | POA: Insufficient documentation

## 2016-12-29 DIAGNOSIS — M549 Dorsalgia, unspecified: Secondary | ICD-10-CM | POA: Diagnosis not present

## 2016-12-29 DIAGNOSIS — M79643 Pain in unspecified hand: Secondary | ICD-10-CM | POA: Diagnosis not present

## 2016-12-29 DIAGNOSIS — M2550 Pain in unspecified joint: Secondary | ICD-10-CM | POA: Diagnosis not present

## 2016-12-29 DIAGNOSIS — M791 Myalgia: Secondary | ICD-10-CM | POA: Diagnosis not present

## 2017-04-19 MED FILL — LEVOCETIRIZINE 5 MG TABLET: 5 | 30 days supply | Qty: 30 | Fill #1

## 2017-04-19 MED FILL — hydrOXYzine HCL 10 MG TABS: 10 | 30 days supply | Qty: 120 | Fill #1

## 2017-04-24 ENCOUNTER — Other Ambulatory Visit: Payer: Self-pay | Admitting: Obstetrics and Gynecology

## 2017-04-24 DIAGNOSIS — Z1231 Encounter for screening mammogram for malignant neoplasm of breast: Secondary | ICD-10-CM

## 2017-05-13 ENCOUNTER — Telehealth: Payer: 59 | Admitting: Physician Assistant

## 2017-05-13 DIAGNOSIS — B9689 Other specified bacterial agents as the cause of diseases classified elsewhere: Secondary | ICD-10-CM

## 2017-05-13 DIAGNOSIS — J019 Acute sinusitis, unspecified: Secondary | ICD-10-CM

## 2017-05-13 MED ORDER — AMOXICILLIN-POT CLAVULANATE 875-125 MG PO TABS: 1.0000 | ORAL_TABLET | Freq: Two times a day (BID) | ORAL | 0 refills | Status: DC

## 2017-05-13 NOTE — Progress Notes (Signed)

## 2017-05-14 MED FILL — AMOX-CLAV 875-125 MG TABLET: 875-125 | 7 days supply | Qty: 14 | Fill #0

## 2017-05-24 DIAGNOSIS — Z1231 Encounter for screening mammogram for malignant neoplasm of breast: Secondary | ICD-10-CM | POA: Diagnosis not present

## 2017-05-24 DIAGNOSIS — Z6827 Body mass index (BMI) 27.0-27.9, adult: Secondary | ICD-10-CM | POA: Diagnosis not present

## 2017-05-24 DIAGNOSIS — Z124 Encounter for screening for malignant neoplasm of cervix: Secondary | ICD-10-CM | POA: Diagnosis not present

## 2017-05-24 DIAGNOSIS — R87618 Other abnormal cytological findings on specimens from cervix uteri: Secondary | ICD-10-CM | POA: Diagnosis not present

## 2017-05-24 DIAGNOSIS — Z01411 Encounter for gynecological examination (general) (routine) with abnormal findings: Secondary | ICD-10-CM | POA: Diagnosis not present

## 2017-07-20 ENCOUNTER — Ambulatory Visit: Payer: 59

## 2017-07-20 DIAGNOSIS — M791 Myalgia, unspecified site: Secondary | ICD-10-CM | POA: Diagnosis not present

## 2017-08-03 MED FILL — FLOVENT HFA 220 MCG INHALER: 220 | 30 days supply | Qty: 12 | Fill #0

## 2017-08-03 MED FILL — predniSONE 10 MG TABS: 10 | 6 days supply | Qty: 21 | Fill #0

## 2017-08-03 MED FILL — VENTOLIN HFA 90 MCG INHALER: 108 (90 BAS | 25 days supply | Qty: 18 | Fill #0

## 2017-08-03 MED FILL — MONTELUKAST SOD 10 MG TAB: 10 | 30 days supply | Qty: 30 | Fill #0

## 2017-11-12 MED FILL — LEVOCETIRIZINE 5 MG TABLET: 5 | 30 days supply | Qty: 30 | Fill #0

## 2017-11-12 MED FILL — hydrOXYzine HCL 10 MG TABS: 10 | 30 days supply | Qty: 120 | Fill #0

## 2017-11-28 ENCOUNTER — Other Ambulatory Visit: Payer: Self-pay | Admitting: Internal Medicine

## 2017-11-28 DIAGNOSIS — E041 Nontoxic single thyroid nodule: Secondary | ICD-10-CM

## 2017-12-03 ENCOUNTER — Ambulatory Visit
Admission: RE | Admit: 2017-12-03 | Discharge: 2017-12-03 | Disposition: A | Discharge status: Home / Self Care | Payer: No Typology Code available for payment source | Source: Ambulatory Visit | Attending: Internal Medicine | Admitting: Internal Medicine

## 2017-12-03 DIAGNOSIS — E041 Nontoxic single thyroid nodule: Secondary | ICD-10-CM

## 2017-12-03 MED FILL — VIT D2 1.25 MG (50,000 UNIT: 1.25 MG | 56 days supply | Qty: 8 | Fill #0

## 2017-12-10 ENCOUNTER — Other Ambulatory Visit: Payer: Self-pay | Admitting: Internal Medicine

## 2017-12-10 DIAGNOSIS — E041 Nontoxic single thyroid nodule: Secondary | ICD-10-CM

## 2017-12-25 ENCOUNTER — Other Ambulatory Visit (HOSPITAL_COMMUNITY)
Admission: RE | Admit: 2017-12-25 | Discharge: 2017-12-25 | Disposition: A | Discharge status: Home / Self Care | Payer: No Typology Code available for payment source | Source: Ambulatory Visit | Attending: Student | Admitting: Student

## 2017-12-25 ENCOUNTER — Ambulatory Visit
Admission: RE | Admit: 2017-12-25 | Discharge: 2017-12-25 | Disposition: A | Discharge status: Home / Self Care | Payer: No Typology Code available for payment source | Source: Ambulatory Visit | Attending: Internal Medicine | Admitting: Internal Medicine

## 2017-12-25 DIAGNOSIS — E041 Nontoxic single thyroid nodule: Secondary | ICD-10-CM | POA: Insufficient documentation

## 2018-01-28 MED FILL — LEVOCETIRIZINE 5 MG TABLET: 5 | 30 days supply | Qty: 30 | Fill #1

## 2018-04-04 MED FILL — hydrOXYzine HCL 10 MG TABS: 10 | 30 days supply | Qty: 120 | Fill #1

## 2018-04-04 MED FILL — LEVOCETIRIZINE 5 MG TABLET: 5 | 30 days supply | Qty: 30 | Fill #2

## 2018-05-03 MED FILL — MUPIROCIN 2% OINTMENT: 2 | 5 days supply | Qty: 22 | Fill #0

## 2018-06-26 MED FILL — AMOXICILLIN 250 MG CAPSULE: 250 | 10 days supply | Qty: 40 | Fill #0

## 2018-10-08 MED FILL — PROAIR HFA 90 MCG INHALER: 108 (90 BAS | 25 days supply | Qty: 9 | Fill #0

## 2018-12-14 MED FILL — DULOXETINE HCL 20 MG CPEP: 20 | 30 days supply | Qty: 60 | Fill #0

## 2018-12-14 MED FILL — hydrOXYzine HCL 25 MG TABS: 25 | 8 days supply | Qty: 30 | Fill #0

## 2018-12-14 MED FILL — QVAR REDIHALER 80 MCG/ACT A: 80 | 60 days supply | Qty: 11 | Fill #0

## 2018-12-14 MED FILL — LEVOCETIRIZINE DIHYDROCHLOR: 5 | 30 days supply | Qty: 30 | Fill #0

## 2018-12-14 MED FILL — ALBUTEROL SULFATE HFA 108 (: 108 (90 BAS | 25 days supply | Qty: 18 | Fill #0

## 2019-01-22 MED FILL — DULoxetine HCL 20 MG CPEP: 20 | 30 days supply | Qty: 60 | Fill #0

## 2019-03-10 MED FILL — predniSONE 5 MG TABS: 5 | 18 days supply | Qty: 63 | Fill #0

## 2019-03-10 MED FILL — DULoxetine HCL 20 MG CPEP: 20 | 30 days supply | Qty: 60 | Fill #0

## 2019-04-09 ENCOUNTER — Encounter: Payer: Self-pay | Admitting: Internal Medicine

## 2019-04-09 ENCOUNTER — Other Ambulatory Visit: Payer: Self-pay

## 2019-04-09 ENCOUNTER — Ambulatory Visit (INDEPENDENT_AMBULATORY_CARE_PROVIDER_SITE_OTHER): Payer: No Typology Code available for payment source | Admitting: Internal Medicine

## 2019-04-09 ENCOUNTER — Ambulatory Visit
Admission: RE | Admit: 2019-04-09 | Discharge: 2019-04-09 | Disposition: A | Discharge status: Home / Self Care | Payer: No Typology Code available for payment source | Source: Ambulatory Visit | Attending: Internal Medicine | Admitting: Internal Medicine

## 2019-04-09 VITALS — BP 101/68 | HR 74 | Temp 98.5°F

## 2019-04-09 DIAGNOSIS — Z227 Latent tuberculosis: Secondary | ICD-10-CM

## 2019-04-09 MED ORDER — ISONIAZID 300 MG PO TABS: 900.0000 mg | ORAL_TABLET | ORAL | 0 refills | Status: DC

## 2019-04-09 MED ORDER — ONDANSETRON HCL 4 MG PO TABS: 4.0000 mg | ORAL_TABLET | Freq: Three times a day (TID) | ORAL | 0 refills | Status: DC | PRN

## 2019-04-09 MED ORDER — VITAMIN B-6 50 MG PO TABS: 50.0000 mg | ORAL_TABLET | Freq: Every day | ORAL | 0 refills | Status: DC

## 2019-04-09 MED ORDER — RIFAPENTINE 150 MG PO TABS: 900.0000 mg | ORAL_TABLET | ORAL | 0 refills | Status: DC

## 2019-04-09 MED FILL — ISONIAZID 300 MG TABLET: 300 | 84 days supply | Qty: 36 | Fill #0

## 2019-04-09 MED FILL — PRIFTIN 150 MG TABLET: 150 | 28 days supply | Qty: 72 | Fill #0

## 2019-04-09 MED FILL — ONDANSETRON HCL 4 MG TABLET: 4 | 7 days supply | Qty: 20 | Fill #0

## 2019-04-09 NOTE — Progress Notes (Signed)
RFV: LTBI  Patient ID: Jamie Melton, female   DOB: 1971-04-30, 48 y.o.   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 She has had good response to prednisone. Her alb work revealed that she has +QTF. But no hx of active tm. She was referred to id in order to determine if need to treat for ltbi before dmards initiated.  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 Has had positive QTF in the past, has had negative cxr in the past  Outpatient Encounter Medications as of 04/09/2019  Medication Sig  . albuterol (PROVENTIL,VENTOLIN) 90 MCG/ACT inhaler Inhale 2 puffs into the lungs every 6 (six) hours as needed for wheezing or shortness of breath.   . Multiple Vitamin (MULTIVITAMIN WITH MINERALS) TABS tablet Take 1 tablet by mouth daily.  Marland Kitchen QVAR REDIHALER 80 MCG/ACT inhaler   . amoxicillin-clavulanate (AUGMENTIN) 875-125 MG tablet Take 1 tablet by mouth 2 (two) times daily.  . ARNUITY ELLIPTA 100 MCG/ACT AEPB Inhale 1 puff into the lungs at bedtime.  . cyclobenzaprine (FLEXERIL) 10 MG tablet Take 1 tablet (10 mg total) by mouth 2 (two) times daily as needed for muscle spasms.  Marland Kitchen HYDROcodone-acetaminophen (NORCO/VICODIN) 5-325 MG tablet Take 2 tablets by mouth every 6 (six) hours as needed for severe pain.  . hydrOXYzine (ATARAX/VISTARIL) 10 MG tablet Take 10 mg by mouth 4 (four) times daily as needed for itching.  Marland Kitchen ibuprofen (ADVIL,MOTRIN) 600 MG tablet Take 1 tablet (600 mg total) by mouth every 6 (six) hours as needed for pain. (Patient not taking: Reported on 03/16/2016)  .  levocetirizine (XYZAL) 5 MG tablet Take 5 mg by mouth at bedtime.  Marland Kitchen oxyCODONE-acetaminophen (PERCOCET/ROXICET) 5-325 MG per tablet Take 1-2 tablets by mouth every 6 (six) hours as needed. (Patient not taking: Reported on 03/16/2016)   No facility-administered encounter medications on file as of 04/09/2019.      Patient Active Problem List   Diagnosis Date Noted  . Vaginal delivery 04/01/2013  . Sterilization 03/30/2013  . PROM (premature rupture of membranes) at term 03/29/2013  . Thrombocytopenia, unspecified ([place]) 03/29/2013  . Chest pain 08/02/2011  . Asthma 08/02/2011  . Palpable abd. aorta 08/02/2011     Health Maintenance Due  Topic Date Due  . PAP SMEAR-Modifier  03/23/1993  . INFLUENZA VACCINE  02/22/2019    Social History   Tobacco Use  . Smoking status: Never Smoker  . Smokeless tobacco: Never Used  Substance Use Topics  . Alcohol use: Yes    Comment: occasional glass of wine  . Drug use: No  family history includes Arthritis in her mother; Hypertension in her father; Kidney disease in her mother; Thyroid disease in her mother. Review of Systems +nightsweats. Purposeful loss of weight #10. 12 point ros is negative Physical Exam   BP 101/68   Pulse 74   Temp 98.5 F (36.9 C)   Physical Exam  Constitutional:  oriented to person, place, and time. appears well-developed and well-nourished. No distress.  HENT: [place]/AT, PERRLA, no scleral icterus Mouth/Throat: Oropharynx is  clear and moist. No oropharyngeal exudate.  Cardiovascular: Normal rate, regular rhythm and normal heart sounds. Exam reveals no gallop and no friction rub.  No murmur heard.  Pulmonary/Chest: Effort normal and breath sounds normal. No respiratory distress.  has no wheezes.  Neck = supple, no nuchal rigidity Abdominal: Soft. Bowel sounds are normal.  exhibits no distension. There is no tenderness.  Lymphadenopathy: no cervical adenopathy. No axillary adenopathy Neurological: alert and oriented  to person, place, and time.  Skin: Skin is warm and dry. No rash noted. No erythema.  Psychiatric: a normal mood and affect.  behavior is normal.    Lab Results  Component Value Date   LABRPR NON REACTIVE 03/29/2013    CBC Lab Results  Component Value Date   WBC 5.3 03/16/2016   RBC 4.08 03/16/2016   HGB 13.1 03/16/2016   HCT 39.2 03/16/2016   PLT 184 03/16/2016   MCV 96.1 03/16/2016   MCH 32.1 03/16/2016   MCHC 33.4 03/16/2016   RDW 12.1 03/16/2016   LYMPHSABS 1.6 03/29/2013   MONOABS 1.1 (H) 03/29/2013   EOSABS 0.2 03/29/2013    BMET Lab Results  Component Value Date   NA 139 03/16/2016   K 3.9 03/16/2016   CL 105 03/16/2016   CO2 28 03/16/2016   GLUCOSE 100 (H) 03/16/2016   BUN 11 03/16/2016   CREATININE 0.80 03/16/2016   CALCIUM 9.3 03/16/2016   GFRNONAA >60 03/16/2016   GFRAA >60 03/16/2016    cxr per my read no infiltrate.  Assessment and Plan: LTBI 3hp ltbi treatment: She is interested in ltbi with rifapentin 900mg  plus inh 900mg  weekly x 12 , with daily vitamin b6  50mg  x 22months Will get baseline cbc,cmp,hiv ab, cxr   Call back in 4 wk for telehealth and repeat blood work

## 2019-04-09 NOTE — Patient Instructions (Signed)
Remember to text me every week after you take your medication, [phone]: dr Havannah Streat

## 2019-04-10 LAB — HIV ANTIBODY (ROUTINE TESTING W REFLEX): HIV 1&2 Ab, 4th Generation: NONREACTIVE

## 2019-04-10 LAB — COMPLETE METABOLIC PANEL WITH GFR
AG Ratio: 1.7 (calc) (ref 1.0–2.5)
ALT: 13 U/L (ref 6–29)
AST: 16 U/L (ref 10–35)
Albumin: 4.3 g/dL (ref 3.6–5.1)
Alkaline phosphatase (APISO): 53 U/L (ref 31–125)
BUN: 10 mg/dL (ref 7–25)
CO2: 26 mmol/L (ref 20–32)
Calcium: 9.6 mg/dL (ref 8.6–10.2)
Chloride: 106 mmol/L (ref 98–110)
Creat: 0.87 mg/dL (ref 0.50–1.10)
GFR, Est African American: 92 mL/min/{1.73_m2} (ref >=60)
GFR, Est Non African American: 79 mL/min/{1.73_m2} (ref >=60)
Globulin: 2.5 g/dL (calc) (ref 1.9–3.7)
Glucose, Bld: 80 mg/dL (ref 65–99)
Potassium: 4 mmol/L (ref 3.5–5.3)
Sodium: 138 mmol/L (ref 135–146)
Total Bilirubin: 0.3 mg/dL (ref 0.2–1.2)
Total Protein: 6.8 g/dL (ref 6.1–8.1)

## 2019-04-10 LAB — CBC
HCT: 32.9 % — ABNORMAL LOW (ref 35.0–45.0)
Hemoglobin: 11 g/dL — ABNORMAL LOW (ref 11.7–15.5)
MCH: 31.7 pg (ref 27.0–33.0)
MCHC: 33.4 g/dL (ref 32.0–36.0)
MCV: 94.8 fL (ref 80.0–100.0)
MPV: 11.6 fL (ref 7.5–12.5)
Platelets: 232 10*3/uL (ref 140–400)
RBC: 3.47 10*6/uL — ABNORMAL LOW (ref 3.80–5.10)
RDW: 11.9 % (ref 11.0–15.0)
WBC: 6.2 10*3/uL (ref 3.8–10.8)

## 2019-05-05 ENCOUNTER — Other Ambulatory Visit: Payer: Self-pay

## 2019-05-05 ENCOUNTER — Ambulatory Visit (INDEPENDENT_AMBULATORY_CARE_PROVIDER_SITE_OTHER): Payer: No Typology Code available for payment source | Admitting: Internal Medicine

## 2019-05-05 DIAGNOSIS — Z227 Latent tuberculosis: Secondary | ICD-10-CM | POA: Diagnosis not present

## 2019-05-05 NOTE — Progress Notes (Signed)
Rfv: tx of ltbi-televisit  Patient ID: Jamie Melton, female   DOB: [birth year], 48 y.o.   MRN: [id number]  HPI  She has been taking ltbi treatment with rifapentin 900mg  plus inh 900mg  weekly x 12 , with daily vitamin b6  50mg  x 32months Today is the 4th week of medicine. No difficulty with medication Outpatient Encounter Medications as of 05/05/2019  Medication Sig   albuterol (PROVENTIL,VENTOLIN) 90 MCG/ACT inhaler Inhale 2 puffs into the lungs every 6 (six) hours as needed for wheezing or shortness of breath.    isoniazid (NYDRAZID) 300 MG tablet Take 3 tablets (900 mg total) by mouth once a week.   Multiple Vitamin (MULTIVITAMIN WITH MINERALS) TABS tablet Take 1 tablet by mouth daily.   ondansetron (ZOFRAN) 4 MG tablet Take 1 tablet (4 mg total) by mouth every 8 (eight) hours as needed for nausea or vomiting.   pyridOXINE (VITAMIN B-6) 50 MG tablet Take 1 tablet (50 mg total) by mouth daily.   QVAR REDIHALER 80 MCG/ACT inhaler    Rifapentine 150 MG TABS Take 6 tablets (900 mg total) by mouth once a week.   amoxicillin-clavulanate (AUGMENTIN) 875-125 MG tablet Take 1 tablet by mouth 2 (two) times daily.   ARNUITY ELLIPTA 100 MCG/ACT AEPB Inhale 1 puff into the lungs at bedtime.   cyclobenzaprine (FLEXERIL) 10 MG tablet Take 1 tablet (10 mg total) by mouth 2 (two) times daily as needed for muscle spasms.   HYDROcodone-acetaminophen (NORCO/VICODIN) 5-325 MG tablet Take 2 tablets by mouth every 6 (six) hours as needed for severe pain.   hydrOXYzine (ATARAX/VISTARIL) 10 MG tablet Take 10 mg by mouth 4 (four) times daily as needed for itching.   ibuprofen (ADVIL,MOTRIN) 600 MG tablet Take 1 tablet (600 mg total) by mouth every 6 (six) hours as needed for pain. (Patient not taking: Reported on 03/16/2016)   levocetirizine (XYZAL) 5 MG tablet Take 5 mg by mouth at bedtime.   oxyCODONE-acetaminophen (PERCOCET/ROXICET) 5-325 MG per tablet Take 1-2 tablets by mouth every 6 (six)  hours as needed. (Patient not taking: Reported on 03/16/2016)   No facility-administered encounter medications on file as of 05/05/2019.      Patient Active Problem List   Diagnosis Date Noted   Vaginal delivery 04/01/2013   Sterilization 03/30/2013   PROM (premature rupture of membranes) at term 03/29/2013   Thrombocytopenia, unspecified ([place]) 03/29/2013   Chest pain 08/02/2011   Asthma 08/02/2011   Palpable abd. aorta 08/02/2011     Health Maintenance Due  Topic Date Due   PAP SMEAR-Modifier  03/23/1993   INFLUENZA VACCINE  02/22/2019    Social History   Tobacco Use   Smoking status: Never Smoker   Smokeless tobacco: Never Used  Substance Use Topics   Alcohol use: Yes    Comment: occasional glass of wine   Drug use: No    Review of Systems Aches AND PAIN OF JOINTS as part of her rheum athritis. 12 point ros otherwise negative Physical Exam   There were no vitals taken for this visit.   NO PHYSICAL EXAM Lab Results  Component Value Date   LABRPR NON REACTIVE 03/29/2013    CBC Lab Results  Component Value Date   WBC 6.2 04/09/2019   RBC 3.47 (L) 04/09/2019   HGB 11.0 (L) 04/09/2019   HCT 32.9 (L) 04/09/2019   PLT 232 04/09/2019   MCV 94.8 04/09/2019   MCH 31.7 04/09/2019   MCHC 33.4 04/09/2019   RDW 11.9 04/09/2019  LYMPHSABS 1.6 03/29/2013   MONOABS 1.1 (H) 03/29/2013   EOSABS 0.2 03/29/2013    BMET Lab Results  Component Value Date   NA 138 04/09/2019   K 4.0 04/09/2019   CL 106 04/09/2019   CO2 26 04/09/2019   GLUCOSE 80 04/09/2019   BUN 10 04/09/2019   CREATININE 0.87 04/09/2019   CALCIUM 9.6 04/09/2019   GFRNONAA 79 04/09/2019   GFRAA 92 04/09/2019    Assessment and Plan  Hx of rheum arthritis = Wants to finish ltbi first before going onto biologic  ltbi = will need to check Lab for liver function in the coming weeks to see that she is tolerating medication

## 2019-05-20 ENCOUNTER — Other Ambulatory Visit: Payer: Self-pay

## 2019-05-20 DIAGNOSIS — Z227 Latent tuberculosis: Secondary | ICD-10-CM

## 2019-05-21 ENCOUNTER — Other Ambulatory Visit: Payer: Self-pay

## 2019-05-21 ENCOUNTER — Other Ambulatory Visit: Payer: No Typology Code available for payment source

## 2019-05-21 ENCOUNTER — Other Ambulatory Visit: Payer: Self-pay | Admitting: Internal Medicine

## 2019-05-21 DIAGNOSIS — Z227 Latent tuberculosis: Secondary | ICD-10-CM

## 2019-05-21 MED ORDER — FLUCONAZOLE 150 MG PO TABS: 150.0000 mg | ORAL_TABLET | Freq: Every day | ORAL | 0 refills | Status: AC

## 2019-05-21 MED FILL — FLUCONAZOLE 150 MG TABLET: 150 | 21 days supply | Qty: 7 | Fill #0

## 2019-05-22 LAB — COMPREHENSIVE METABOLIC PANEL
AG Ratio: 1.6 (calc) (ref 1.0–2.5)
ALT: 11 U/L (ref 6–29)
AST: 16 U/L (ref 10–35)
Albumin: 4 g/dL (ref 3.6–5.1)
Alkaline phosphatase (APISO): 57 U/L (ref 31–125)
BUN: 11 mg/dL (ref 7–25)
CO2: 26 mmol/L (ref 20–32)
Calcium: 9.3 mg/dL (ref 8.6–10.2)
Chloride: 106 mmol/L (ref 98–110)
Creat: 0.76 mg/dL (ref 0.50–1.10)
Globulin: 2.5 g/dL (calc) (ref 1.9–3.7)
Glucose, Bld: 94 mg/dL (ref 65–99)
Potassium: 4 mmol/L (ref 3.5–5.3)
Sodium: 140 mmol/L (ref 135–146)
Total Bilirubin: 0.3 mg/dL (ref 0.2–1.2)
Total Protein: 6.5 g/dL (ref 6.1–8.1)

## 2019-07-03 MED FILL — ALBUTEROL SULFATE HFA 108 (: 108 (90 BAS | 25 days supply | Qty: 7 | Fill #0

## 2019-07-07 ENCOUNTER — Ambulatory Visit (INDEPENDENT_AMBULATORY_CARE_PROVIDER_SITE_OTHER): Payer: No Typology Code available for payment source | Admitting: Internal Medicine

## 2019-07-07 ENCOUNTER — Other Ambulatory Visit: Payer: Self-pay

## 2019-07-07 DIAGNOSIS — Z227 Latent tuberculosis: Secondary | ICD-10-CM | POA: Diagnosis not present

## 2019-07-16 NOTE — Progress Notes (Signed)
RFV: follow up for :TBI -televisit by phone, identified by:  Patient ID: Jamie Melton, female   DOB: Jul 07, 1971, 48 y.o.   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 She has tolerated her regimen, no missed doses, sends text confirming taking her medication. She denies issues with taking medications.  Outpatient Encounter Medications as of 07/07/2019  Medication Sig  . albuterol (PROVENTIL,VENTOLIN) 90 MCG/ACT inhaler Inhale 2 puffs into the lungs every 6 (six) hours as needed for wheezing or shortness of breath.   Marland Kitchen amoxicillin-clavulanate (AUGMENTIN) 875-125 MG tablet Take 1 tablet by mouth 2 (two) times daily.  . ARNUITY ELLIPTA 100 MCG/ACT AEPB Inhale 1 puff into the lungs at bedtime.  . cyclobenzaprine (FLEXERIL) 10 MG tablet Take 1 tablet (10 mg total) by mouth 2 (two) times daily as needed for muscle spasms.  Marland Kitchen HYDROcodone-acetaminophen (NORCO/VICODIN) 5-325 MG tablet Take 2 tablets by mouth every 6 (six) hours as needed for severe pain.  . hydrOXYzine (ATARAX/VISTARIL) 10 MG tablet Take 10 mg by mouth 4 (four) times daily as needed for itching.  Marland Kitchen ibuprofen (ADVIL,MOTRIN) 600 MG tablet Take 1 tablet (600 mg total) by mouth every 6 (six) hours as needed for pain. (Patient not taking: Reported on 03/16/2016)  . isoniazid (NYDRAZID) 300 MG tablet Take 3 tablets (900 mg total) by mouth once a week.  . levocetirizine (XYZAL) 5 MG tablet Take 5 mg by mouth at bedtime.  . Multiple Vitamin (MULTIVITAMIN WITH MINERALS) TABS tablet Take 1 tablet by mouth daily.  . ondansetron (ZOFRAN) 4 MG tablet Take 1 tablet (4 mg total) by mouth every 8 (eight) hours as needed for nausea or vomiting.  Marland Kitchen oxyCODONE-acetaminophen (PERCOCET/ROXICET) 5-325 MG per tablet Take 1-2 tablets by mouth every 6 (six) hours as needed. (Patient not taking: Reported on 03/16/2016)  . pyridOXINE (VITAMIN B-6) 50 MG tablet Take 1 tablet (50 mg total) by  mouth daily.  Marland Kitchen QVAR REDIHALER 80 MCG/ACT inhaler   . Rifapentine 150 MG TABS Take 6 tablets (900 mg total) by mouth once a week.   No facility-administered encounter medications on file as of 07/07/2019.     Patient Active Problem List   Diagnosis Date Noted  . Vaginal delivery 04/01/2013  . Sterilization 03/30/2013  . PROM (premature rupture of membranes) at term 03/29/2013  . Thrombocytopenia, unspecified ([place]) 03/29/2013  . Chest pain 08/02/2011  . Asthma 08/02/2011  . Palpable abd. aorta 08/02/2011     Health Maintenance Due  Topic Date Due  . PAP SMEAR-Modifier  03/23/1993  . INFLUENZA VACCINE  02/22/2019    Social History   Tobacco Use  . Smoking status: Never Smoker  . Smokeless tobacco: Never Used  Substance Use Topics  . Alcohol use: Yes    Comment: occasional glass of wine  . Drug use: No   Review of Systems Review of Systems  Constitutional: Negative for fever, chills, diaphoresis, activity change, appetite change, fatigue and unexpected weight change.  HENT: Negative for congestion, sore throat, rhinorrhea, sneezing, trouble swallowing and sinus pressure.  Eyes: Negative for photophobia and visual disturbance.  Respiratory: Negative for cough, chest tightness, shortness of breath, wheezing and stridor.  Cardiovascular: Negative for chest pain, palpitations and leg swelling.  Gastrointestinal: Negative for nausea, vomiting, abdominal pain, diarrhea, constipation, blood in stool, abdominal distention and anal bleeding.  Genitourinary: Negative for dysuria, hematuria, flank pain and difficulty urinating.  Musculoskeletal: Negative for myalgias, back pain,  joint swelling, arthralgias and gait problem.  Skin: Negative for color change, pallor, rash and wound.  Neurological: Negative for dizziness, tremors, weakness and light-headedness.  Hematological: Negative for adenopathy. Does not bruise/bleed easily.  Psychiatric/Behavioral: Negative for behavioral  problems, confusion, sleep disturbance, dysphoric mood, decreased concentration and agitation.    Physical Exam   There were no vitals taken for this visit.   Did not examine Lab Results  Component Value Date   LABRPR NON REACTIVE 03/29/2013    CBC Lab Results  Component Value Date   WBC 6.2 04/09/2019   RBC 3.47 (L) 04/09/2019   HGB 11.0 (L) 04/09/2019   HCT 32.9 (L) 04/09/2019   PLT 232 04/09/2019   MCV 94.8 04/09/2019   MCH 31.7 04/09/2019   MCHC 33.4 04/09/2019   RDW 11.9 04/09/2019   LYMPHSABS 1.6 03/29/2013   MONOABS 1.1 (H) 03/29/2013   EOSABS 0.2 03/29/2013    BMET Lab Results  Component Value Date   NA 140 05/21/2019   K 4.0 05/21/2019   CL 106 05/21/2019   CO2 26 05/21/2019   GLUCOSE 94 05/21/2019   BUN 11 05/21/2019   CREATININE 0.76 05/21/2019   CALCIUM 9.3 05/21/2019   GFRNONAA 79 04/09/2019   GFRAA 92 04/09/2019      Assessment and Plan   ltbi =nearly completed with 12 week course of treatment. Will provide letter if needed.  ra = can take dmard at completion of LTBI therapy

## 2019-08-06 ENCOUNTER — Other Ambulatory Visit: Payer: No Typology Code available for payment source

## 2019-08-08 ENCOUNTER — Ambulatory Visit
Admission: RE | Admit: 2019-08-08 | Discharge: 2019-08-08 | Disposition: A | Discharge status: Other Acute Inpatient Hospital | Payer: No Typology Code available for payment source | Source: Ambulatory Visit

## 2019-08-08 NOTE — ED Provider Notes (Signed)
Patient is coming to Urgent Care to be seen in person.  She reports she is COVID positive and has wheezing.  She denies acute respiratory distress.   Sharion Balloon, NP 08/08/19 1649

## 2019-08-09 ENCOUNTER — Telehealth: Payer: Self-pay | Admitting: Physician Assistant

## 2019-08-09 ENCOUNTER — Other Ambulatory Visit: Payer: Self-pay | Admitting: Physician Assistant

## 2019-08-09 ENCOUNTER — Encounter (HOSPITAL_COMMUNITY): Payer: Self-pay

## 2019-08-09 ENCOUNTER — Other Ambulatory Visit: Payer: Self-pay

## 2019-08-09 ENCOUNTER — Ambulatory Visit (HOSPITAL_COMMUNITY)
Admission: EM | Admit: 2019-08-09 | Discharge: 2019-08-09 | Disposition: A | Discharge status: Home / Self Care | Payer: No Typology Code available for payment source | Attending: Urgent Care | Admitting: Urgent Care

## 2019-08-09 DIAGNOSIS — U071 COVID-19: Secondary | ICD-10-CM

## 2019-08-09 DIAGNOSIS — R062 Wheezing: Secondary | ICD-10-CM

## 2019-08-09 DIAGNOSIS — J4530 Mild persistent asthma, uncomplicated: Secondary | ICD-10-CM

## 2019-08-09 DIAGNOSIS — R0602 Shortness of breath: Secondary | ICD-10-CM | POA: Diagnosis not present

## 2019-08-09 DIAGNOSIS — Z227 Latent tuberculosis: Secondary | ICD-10-CM

## 2019-08-09 DIAGNOSIS — M1990 Unspecified osteoarthritis, unspecified site: Secondary | ICD-10-CM

## 2019-08-09 MED ORDER — ALBUTEROL SULFATE HFA 108 (90 BASE) MCG/ACT IN AERS: 1.0000 | INHALATION_SPRAY | Freq: Four times a day (QID) | RESPIRATORY_TRACT | 0 refills | Status: DC | PRN

## 2019-08-09 MED ORDER — PREDNISONE 20 MG PO TABS: ORAL_TABLET | ORAL | 0 refills | Status: DC

## 2019-08-09 NOTE — Telephone Encounter (Signed)
  I connected by phone with Jamie Melton on 08/09/2019 at 2:54 PM to discuss the potential use of an new treatment for mild to moderate COVID-19 viral infection in non-hospitalized patients.  This patient is a 49 y.o. female that meets the FDA criteria for Emergency Use Authorization of bamlanivimab or casirivimab\imdevimab.  Has a (+) direct SARS-CoV-2 viral test result  Has mild or moderate COVID-19   Is ? 48 years of age and weighs ? 40 kg  Is NOT hospitalized due to COVID-19  Is NOT requiring oxygen therapy or requiring an increase in baseline oxygen flow rate due to COVID-19  Is within 10 days of symptom onset  Has at least one of the high risk factor(s) for progression to severe COVID-19 and/or hospitalization as defined in EUA.  Specific high risk criteria : Immunosuppressive Disease   I have spoken and communicated the following to the patient or parent/caregiver:  1. FDA has authorized the emergency use of bamlanivimab and casirivimab\imdevimab for the treatment of mild to moderate COVID-19 in adults and pediatric patients with positive results of direct SARS-CoV-2 viral testing who are 64 years of age and older weighing at least 40 kg, and who are at high risk for progressing to severe COVID-19 and/or hospitalization.  2. The significant known and potential risks and benefits of bamlanivimab and casirivimab\imdevimab, and the extent to which such potential risks and benefits are unknown.  3. Information on available alternative treatments and the risks and benefits of those alternatives, including clinical trials.  4. Patients treated with bamlanivimab and casirivimab\imdevimab should continue to self-isolate and use infection control measures (e.g., wear mask, isolate, social distance, avoid sharing personal items, clean and disinfect "high touch" surfaces, and frequent handwashing) according to CDC guidelines.   5. The patient or parent/caregiver has the option to accept  or refuse bamlanivimab or casirivimab\imdevimab .  After reviewing this information with the patient, the patient agreed to proceed with casirivimab\imdevimab infusion and will be provided a copy of the Fact Sheet prior to receiving the infusion.   She is scheduled for 08/12/19 @ 4:30pm for [facility].   Jamie Melton 08/09/2019 2:54 PM

## 2019-08-09 NOTE — ED Provider Notes (Signed)
[facility]   MRN: [id number] DOB: [birth year]  Subjective:   Jamie Melton is a 49 y.o. female presenting for recheck on shortness of breath and wheezing which is worse at night.  Patient has a history of asthma and has been using her albuterol inhaler with very temporary relief.  She is requesting an oral steroid course.  She did test positive for Covid this past week.  Has not been able to get in touch with her PCP.  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  No current facility-administered medications for this encounter.  Current Outpatient Medications:  .  albuterol (PROVENTIL,VENTOLIN) 90 MCG/ACT inhaler, Inhale 2 puffs into the lungs every 6 (six) hours as needed for wheezing or shortness of breath. , Disp: , Rfl:  .  amoxicillin-clavulanate (AUGMENTIN) 875-125 MG tablet, Take 1 tablet by mouth 2 (two) times daily., Disp: 14 tablet, Rfl: 0 .  ARNUITY ELLIPTA 100 MCG/ACT AEPB, Inhale 1 puff into the lungs at bedtime., Disp: , Rfl: 3 .  cyclobenzaprine (FLEXERIL) 10 MG tablet, Take 1 tablet (10 mg total) by mouth 2 (two) times daily as needed for muscle spasms., Disp: 20 tablet, Rfl: 0 .  HYDROcodone-acetaminophen (NORCO/VICODIN) 5-325 MG tablet, Take 2 tablets by mouth every 6 (six) hours as needed for severe pain., Disp: 10 tablet, Rfl: 0 .  hydrOXYzine (ATARAX/VISTARIL) 10 MG tablet, Take 10 mg by mouth 4 (four) times daily as needed for itching., Disp: , Rfl: 3 .  ibuprofen (ADVIL,MOTRIN) 600 MG tablet, Take 1 tablet (600 mg total) by mouth every 6 (six) hours as needed for pain. (Patient not taking: Reported on 03/16/2016), Disp: 30 tablet, Rfl: 1 .  isoniazid (NYDRAZID) 300 MG tablet, Take 3 tablets (900 mg total) by mouth once a week., Disp: 36 tablet, Rfl: 0 .  levocetirizine (XYZAL) 5 MG tablet, Take 5 mg by mouth at bedtime., Disp: , Rfl: 3 .  Multiple Vitamin (MULTIVITAMIN WITH MINERALS) TABS tablet, Take 1 tablet by mouth daily.,  Disp: , Rfl:  .  ondansetron (ZOFRAN) 4 MG tablet, Take 1 tablet (4 mg total) by mouth every 8 (eight) hours as needed for nausea or vomiting., Disp: 20 tablet, Rfl: 0 .  oxyCODONE-acetaminophen (PERCOCET/ROXICET) 5-325 MG per tablet, Take 1-2 tablets by mouth every 6 (six) hours as needed. (Patient not taking: Reported on 03/16/2016), Disp: 30 tablet, Rfl: 0 .  pyridOXINE (VITAMIN B-6) 50 MG tablet, Take 1 tablet (50 mg total) by mouth daily., Disp: 90 tablet, Rfl: 0 .  QVAR REDIHALER 80 MCG/ACT inhaler, , Disp: , Rfl:  .  Rifapentine 150 MG TABS, Take 6 tablets (900 mg total) by mouth once a week., Disp: 72 tablet, Rfl: 0   No Known Allergies  Past Medical History:  Diagnosis Date  . Asthma    DR Carmelina Peal  . Fibroid 2012  . Headache(784.0)    FREQUENT  . Infection    UTI  . Pain in joint, multiple sites   . Unspecified abortion, complicated by metabolic disorder, unspecified      Past Surgical History:  Procedure Laterality Date  . APPENDECTOMY    . BILATERAL SALPINGECTOMY Bilateral 03/30/2013   Procedure: BILATERAL SALPINGECTOMY;  Surgeon: Eldred Manges, MD;  Location: [facility];  Service: Obstetrics;  Laterality: Bilateral;  . CESAREAN SECTION N/A 03/30/2013   Procedure: Primary CESAREAN SECTION of baby  boy at [place] [facility] 8/9;  Surgeon: Eldred Manges, MD;  Location: [facility];  Service: Obstetrics;  Laterality: N/A;  . NASAL SINUS SURGERY      Family History  Problem Relation Age of Onset  . Arthritis Mother   . Kidney disease Mother        STONES  . Thyroid disease Mother   . Hypertension Father     Social History   Tobacco Use  . Smoking status: Never Smoker  . Smokeless tobacco: Never Used  Substance Use Topics  . Alcohol use: Yes    Comment: occasional glass of wine  . Drug use: No    ROS Denies fever, chest pain, confusion, respiratory distress.  Objective:   Vitals: BP 129/72 (BP Location: Left Arm)   Pulse 74   Temp 99.3 F (37.4 C) (Oral)   Resp 17    SpO2 99%   Physical Exam Constitutional:      General: She is not in acute distress.    Appearance: Normal appearance. She is well-developed. She is not ill-appearing, toxic-appearing or diaphoretic.  HENT:     Head: Normocephalic and atraumatic.     Nose: Nose normal.     Mouth/Throat:     Mouth: Mucous membranes are moist.     Pharynx: Oropharynx is clear.  Eyes:     General: No scleral icterus.    Extraocular Movements: Extraocular movements intact.     Pupils: Pupils are equal, round, and reactive to light.  Cardiovascular:     Rate and Rhythm: Normal rate and regular rhythm.     Pulses: Normal pulses.     Heart sounds: Normal heart sounds. No murmur. No friction rub. No gallop.   Pulmonary:     Effort: Pulmonary effort is normal. No respiratory distress.     Breath sounds: No stridor. Examination of the right-upper field reveals wheezing. Examination of the left-upper field reveals wheezing. Examination of the right-middle field reveals wheezing. Examination of the left-middle field reveals wheezing. Wheezing (Mild) present. No rhonchi or rales.  Skin:    General: Skin is warm and dry.     Findings: No rash.  Neurological:     General: No focal deficit present.     Mental Status: She is alert and oriented to person, place, and time.  Psychiatric:        Mood and Affect: Mood normal.        Behavior: Behavior normal.        Thought Content: Thought content normal.     Assessment and Plan :   1. SOB (shortness of breath)   2. Wheezing   3. Mild persistent asthma without complication   4. COVID-19    Patient is to start oral prednisone course for mild exacerbation of her asthma in the setting of COVID-19 consistent with current UpToDate guidelines.  I refilled her albuterol inhaler as well. Referred to Infusion clinic. Counseled patient on potential for adverse effects with medications prescribed/recommended today, ER and return-to-clinic precautions discussed, patient  verbalized understanding.    Jaynee Eagles, PA-C 08/09/19 1331

## 2019-08-09 NOTE — ED Triage Notes (Signed)
Patient presents to Urgent Care with complaints of continued sob and wheezing since three days before being diagnosed w/ covid earlier this week. Patient reports she has tried to get in touch w/ her PCP but they have not returned her calls, pt believes she needs steroids.

## 2019-08-11 MED FILL — ALBUTEROL SULFATE HFA 108 (: 108 (90 BAS | 25 days supply | Qty: 18 | Fill #0

## 2019-08-12 ENCOUNTER — Ambulatory Visit (HOSPITAL_COMMUNITY)
Admission: RE | Admit: 2019-08-12 | Discharge: 2019-08-12 | Disposition: A | Discharge status: Home / Self Care | Payer: No Typology Code available for payment source | Source: Ambulatory Visit | Attending: Pulmonary Disease | Admitting: Pulmonary Disease

## 2019-08-12 DIAGNOSIS — M1990 Unspecified osteoarthritis, unspecified site: Secondary | ICD-10-CM | POA: Insufficient documentation

## 2019-08-12 DIAGNOSIS — Z23 Encounter for immunization: Secondary | ICD-10-CM | POA: Insufficient documentation

## 2019-08-12 DIAGNOSIS — U071 COVID-19: Secondary | ICD-10-CM | POA: Insufficient documentation

## 2019-08-12 DIAGNOSIS — Z227 Latent tuberculosis: Secondary | ICD-10-CM | POA: Insufficient documentation

## 2019-08-12 MED ORDER — EPINEPHRINE 0.3 MG/0.3ML IJ SOAJ: 0.3000 mg | Freq: Once | INTRAMUSCULAR | Status: DC | PRN

## 2019-08-12 MED ORDER — ALBUTEROL SULFATE HFA 108 (90 BASE) MCG/ACT IN AERS: 2.0000 | INHALATION_SPRAY | Freq: Once | RESPIRATORY_TRACT | Status: DC | PRN

## 2019-08-12 MED ORDER — SODIUM CHLORIDE 0.9 % IV SOLN
Freq: Once | INTRAVENOUS | Status: AC
  Filled 2019-08-12: qty 10

## 2019-08-12 MED ORDER — METHYLPREDNISOLONE SODIUM SUCC 125 MG IJ SOLR: 125.0000 mg | Freq: Once | INTRAMUSCULAR | Status: DC | PRN

## 2019-08-12 MED ORDER — DIPHENHYDRAMINE HCL 50 MG/ML IJ SOLN: 50.0000 mg | Freq: Once | INTRAMUSCULAR | Status: DC | PRN

## 2019-08-12 MED ORDER — SODIUM CHLORIDE 0.9 % IV SOLN
INTRAVENOUS | Status: DC | PRN
  Administered 2019-08-12: 250 mL via INTRAVENOUS

## 2019-08-12 MED ORDER — FAMOTIDINE IN NACL 20-0.9 MG/50ML-% IV SOLN: 20.0000 mg | Freq: Once | INTRAVENOUS | Status: DC | PRN

## 2019-08-12 NOTE — Progress Notes (Signed)
  Diagnosis: COVID-19  Physician: Dr. Joya Gaskins  Procedure: Covid Infusion Clinic Med: casirivimab\imdevimab infusion - Provided patient with casirivimab\imdevimab fact sheet for patients, parents and caregivers prior to infusion.  Complications: No immediate complications noted.  Discharge: Discharged home   Tia Masker 08/12/2019

## 2019-08-12 NOTE — Discharge Instructions (Signed)
10 Things You Can Do to Manage Your COVID-19 Symptoms at Home If you have possible or confirmed COVID-19: 1. Stay home from work and school. And stay away from other public places. If you must go out, avoid using any kind of public transportation, ridesharing, or taxis. 2. Monitor your symptoms carefully. If your symptoms get worse, call your healthcare provider immediately. 3. Get rest and stay hydrated. 4. If you have a medical appointment, call the healthcare provider ahead of time and tell them that you have or may have COVID-19. 5. For medical emergencies, call 911 and notify the dispatch personnel that you have or may have COVID-19. 6. Cover your cough and sneezes with a tissue or use the inside of your elbow. 7. Wash your hands often with soap and water for at least 20 seconds or clean your hands with an alcohol-based hand sanitizer that contains at least 60% alcohol. 8. As much as possible, stay in a specific room and away from other people in your home. Also, you should use a separate bathroom, if available. If you need to be around other people in or outside of the home, wear a mask. 9. Avoid sharing personal items with other people in your household, like dishes, towels, and bedding. 10. Clean all surfaces that are touched often, like counters, tabletops, and doorknobs. Use household cleaning sprays or wipes according to the label instructions. cdc.gov/coronavirus 01/22/2019 This information is not intended to replace advice given to you by your health care provider. Make sure you discuss any questions you have with your health care provider. Document Revised: 06/26/2019 Document Reviewed: 06/26/2019 Elsevier Patient Education  2020 Elsevier Inc.  

## 2019-10-01 ENCOUNTER — Ambulatory Visit (HOSPITAL_BASED_OUTPATIENT_CLINIC_OR_DEPARTMENT_OTHER): Payer: No Typology Code available for payment source | Admitting: Pharmacist

## 2019-10-01 ENCOUNTER — Other Ambulatory Visit: Payer: Self-pay

## 2019-10-01 DIAGNOSIS — Z79899 Other long term (current) drug therapy: Secondary | ICD-10-CM

## 2019-10-01 MED ORDER — HUMIRA PEN 40 MG/0.8ML ~~LOC~~ PNKT: 40.0000 mg | PEN_INJECTOR | SUBCUTANEOUS | 3 refills | Status: DC

## 2019-10-01 MED FILL — HUMIRA PEN 40 MG/0.8ML PNKT: 40 | 28 days supply | Qty: 2 | Fill #0

## 2019-10-01 NOTE — Progress Notes (Signed)
  S: Patient presents for review of their specialty medication therapy.  Patient is currently prescribed Humira for ankylosing spondylitis. Patient is managed by Dr. Kathlene November for this.   Adherence: has not started  Efficacy: has not started   Dosing:  Ankylosing spondylitis: SubQ: 40 mg every other week (may continue methotrexate, other nonbiologic DMARDS, corticosteroids, NSAIDs and/or analgesics)  Dose adjustments: Renal: no dose adjustments (has not been studied) Hepatic: no dose adjustments (has not been studied)  Drug-drug interactions: none identified   Screening: TB test: per pt, she recently completed tx for LTBI ~1 month ago.  Hepatitis: completed per pt  Monitoring: S/sx of infection: none currently  CBC: monitored by Dr. Kathlene November S/sx of hypersensitivity: none  S/sx of malignancy: none S/sx of heart failure: none   Other side effects: none   O: Lab Results  Component Value Date   WBC 6.2 04/09/2019   HGB 11.0 (L) 04/09/2019   HCT 32.9 (L) 04/09/2019   MCV 94.8 04/09/2019   PLT 232 04/09/2019      Chemistry      Component Value Date/Time   NA 140 05/21/2019 1443   K 4.0 05/21/2019 1443   CL 106 05/21/2019 1443   CO2 26 05/21/2019 1443   BUN 11 05/21/2019 1443   CREATININE 0.76 05/21/2019 1443      Component Value Date/Time   CALCIUM 9.3 05/21/2019 1443   AST 16 05/21/2019 1443   ALT 11 05/21/2019 1443   BILITOT 0.3 05/21/2019 1443       A/P: 1. Medication review: Patient currently prescribed Humira for ankylosing spondylitis. Reviewed the medication with the patient, including the following: Humira is a TNF blocking agent indicated for ankylosing spondylitis, Crohn's disease, Hidradenitis suppurativa, psoriatic arthritis, plaque psoriasis, ulcerative colitis, and uveitis. Patient educated on purpose, proper use and potential adverse effects of Humira. Possible adverse effects are increased risk of infections, headache, and injection site reactions.  There is the possibility of an increased risk of malignancy but it is not well understood if this increased risk is due to there medication or the disease state. There are rare cases of pancytopenia and aplastic anemia. For SubQ injection at separate sites in the thigh or lower abdomen (avoiding areas within 2 inches of navel); rotate injection sites. May leave at room temperature for ~15 to 30 minutes prior to use; do not remove cap or cover while allowing product to reach room temperature. Do not use if solution is discolored or contains particulate matter. Do not administer to skin which is red, tender, bruised, hard, or that has scars, stretch marks, or psoriasis plaques. Needle cap of the prefilled syringe or needle cover for the adalimumab pen may contain latex. Prefilled pens and syringes are available for use by patients and the full amount of the syringe should be injected (self-administration); the vial is intended for institutional use only. Vials do not contain a preservative; discard unused portion.   Advised patient to follow-up with Dr. Baxter Flattery to confirm tx success for LTBI. Per chart review, she is okay to start Humira after completion of LTBI treatment.   No recommendations for any additional changes at this time.   Benard Halsted, PharmD, [place] [phone]

## 2019-10-31 MED FILL — HUMIRA PEN 40 MG/0.8ML PNKT: 40 | 28 days supply | Qty: 2 | Fill #1

## 2019-11-28 MED FILL — HUMIRA PEN 40 MG/0.8ML PNKT: 40 | 28 days supply | Qty: 2 | Fill #2

## 2019-12-15 ENCOUNTER — Other Ambulatory Visit (HOSPITAL_COMMUNITY): Payer: Self-pay | Admitting: Internal Medicine

## 2019-12-26 MED FILL — HUMIRA PEN 40 MG/0.8ML PNKT: 40 | 28 days supply | Qty: 2 | Fill #3

## 2020-01-21 ENCOUNTER — Other Ambulatory Visit: Payer: Self-pay | Admitting: Pharmacist

## 2020-01-21 MED ORDER — HUMIRA PEN 40 MG/0.8ML ~~LOC~~ PNKT: 40.0000 mg | PEN_INJECTOR | SUBCUTANEOUS | 2 refills | Status: DC

## 2020-01-28 MED FILL — HUMIRA PEN 40 MG/0.8ML PNKT: 40 | 28 days supply | Qty: 2 | Fill #0

## 2020-02-06 MED FILL — ALBUTEROL SULFATE HFA 108 (: 108 (90 BAS | 25 days supply | Qty: 9 | Fill #0

## 2020-03-01 MED FILL — HUMIRA PEN 40 MG/0.8ML PNKT: 40 | 28 days supply | Qty: 2 | Fill #1

## 2020-05-06 MED FILL — HUMIRA PEN 40 MG/0.8ML PNKT: 40 | 28 days supply | Qty: 2 | Fill #2

## 2020-05-10 ENCOUNTER — Other Ambulatory Visit: Payer: Self-pay | Admitting: Internal Medicine

## 2020-05-10 DIAGNOSIS — M542 Cervicalgia: Secondary | ICD-10-CM

## 2020-05-10 DIAGNOSIS — E041 Nontoxic single thyroid nodule: Secondary | ICD-10-CM

## 2020-05-24 ENCOUNTER — Ambulatory Visit
Admission: RE | Admit: 2020-05-24 | Discharge: 2020-05-24 | Disposition: A | Discharge status: Home / Self Care | Payer: No Typology Code available for payment source | Source: Ambulatory Visit | Attending: Internal Medicine | Admitting: Internal Medicine

## 2020-05-24 DIAGNOSIS — M542 Cervicalgia: Secondary | ICD-10-CM

## 2020-05-24 DIAGNOSIS — E041 Nontoxic single thyroid nodule: Secondary | ICD-10-CM

## 2020-06-07 MED FILL — ALBUTEROL SULFATE HFA 108 (: 108 (90 BAS | 25 days supply | Qty: 9 | Fill #1

## 2020-06-08 ENCOUNTER — Other Ambulatory Visit: Payer: Self-pay | Admitting: Pharmacist

## 2020-06-08 MED ORDER — HUMIRA PEN 40 MG/0.8ML ~~LOC~~ PNKT: 40.0000 mg | PEN_INJECTOR | SUBCUTANEOUS | 2 refills | Status: DC

## 2020-06-14 MED FILL — HUMIRA PEN 40 MG/0.8ML PNKT: 40 | 28 days supply | Qty: 2 | Fill #0

## 2020-06-22 MED FILL — HUMIRA PEN 40 MG/0.8ML PNKT: 40 | 28 days supply | Qty: 2 | Fill #0

## 2020-07-19 MED FILL — HUMIRA PEN 40 MG/0.8ML PNKT: 40 | 28 days supply | Qty: 2 | Fill #1

## 2020-08-02 ENCOUNTER — Ambulatory Visit: Payer: No Typology Code available for payment source

## 2020-08-03 ENCOUNTER — Ambulatory Visit: Payer: No Typology Code available for payment source | Attending: Internal Medicine

## 2020-08-03 DIAGNOSIS — Z23 Encounter for immunization: Secondary | ICD-10-CM

## 2020-08-03 NOTE — Progress Notes (Signed)
   Covid-19 Vaccination Clinic  Name:  Bora Bost Mccune    MRN: [id number] DOB: Aug 27, 1970  08/03/2020  Ms. Bonifas was observed post Covid-19 immunization for 15 minutes without incident. She was provided with Vaccine Information Sheet and instruction to access the V-Safe system.   Ms. Michl was instructed to call 911 with any severe reactions post vaccine: Marland Kitchen Difficulty breathing  . Swelling of face and throat  . A fast heartbeat  . A bad rash all over body  . Dizziness and weakness   Immunizations Administered    Name Date Dose VIS Date Route   Moderna Covid-19 Booster Vaccine 08/03/2020  2:36 PM 0.25 mL 05/12/2020 Intramuscular   Manufacturer: Levan Hurst   Lot: [id number]   [place]: [id number]

## 2020-09-01 MED FILL — HUMIRA PEN 40 MG/0.8ML PNKT: 40 | 28 days supply | Qty: 2 | Fill #2

## 2020-10-01 ENCOUNTER — Other Ambulatory Visit: Payer: Self-pay

## 2020-10-01 ENCOUNTER — Ambulatory Visit: Payer: No Typology Code available for payment source | Attending: Family Medicine | Admitting: Pharmacist

## 2020-10-01 ENCOUNTER — Other Ambulatory Visit: Payer: Self-pay | Admitting: Internal Medicine

## 2020-10-01 DIAGNOSIS — Z79899 Other long term (current) drug therapy: Secondary | ICD-10-CM

## 2020-10-01 MED ORDER — HUMIRA PEN 40 MG/0.8ML ~~LOC~~ PNKT: 40.0000 mg | PEN_INJECTOR | SUBCUTANEOUS | 2 refills | Status: DC

## 2020-10-01 MED FILL — ALBUTEROL SULFATE HFA 108 (: 108 (90 BAS | 25 days supply | Qty: 9 | Fill #2

## 2020-10-01 NOTE — Progress Notes (Signed)
  S: Patient presents for review of their specialty medication therapy.  Patient is currently prescribed Humira for ankylosing spondylitis. Patient is managed by Dr. Kathlene November for this.   Adherence: confirms  Efficacy: pt reports improvement in symptoms since starting Humira  Dosing:  Ankylosing spondylitis: SubQ: 40 mg every other week (may continue methotrexate, other nonbiologic DMARDS, corticosteroids, NSAIDs and/or analgesics)  Dose adjustments: Renal: no dose adjustments (has not been studied) Hepatic: no dose adjustments (has not been studied)  Drug-drug interactions: none identified   Screening: TB test: completed tx for LTBI Hepatitis: completed per pt  Monitoring: S/sx of infection: none currently  CBC: monitored by Dr. Kathlene November S/sx of hypersensitivity: none  S/sx of malignancy: none S/sx of heart failure: none   Other side effects: none   O: Lab Results  Component Value Date   WBC 6.2 04/09/2019   HGB 11.0 (L) 04/09/2019   HCT 32.9 (L) 04/09/2019   MCV 94.8 04/09/2019   PLT 232 04/09/2019      Chemistry      Component Value Date/Time   NA 140 05/21/2019 1443   K 4.0 05/21/2019 1443   CL 106 05/21/2019 1443   CO2 26 05/21/2019 1443   BUN 11 05/21/2019 1443   CREATININE 0.76 05/21/2019 1443      Component Value Date/Time   CALCIUM 9.3 05/21/2019 1443   AST 16 05/21/2019 1443   ALT 11 05/21/2019 1443   BILITOT 0.3 05/21/2019 1443       A/P: 1. Medication review: Patient currently taking Humira for ankylosing spondylitis. Reviewed the medication with the patient, including the following: Humira is a TNF blocking agent indicated for ankylosing spondylitis, Crohn's disease, Hidradenitis suppurativa, psoriatic arthritis, plaque psoriasis, ulcerative colitis, and uveitis. Patient educated on purpose, proper use and potential adverse effects of Humira. Possible adverse effects are increased risk of infections, headache, and injection site reactions. There is  the possibility of an increased risk of malignancy but it is not well understood if this increased risk is due to there medication or the disease state. There are rare cases of pancytopenia and aplastic anemia. For SubQ injection at separate sites in the thigh or lower abdomen (avoiding areas within 2 inches of navel); rotate injection sites. May leave at room temperature for ~15 to 30 minutes prior to use; do not remove cap or cover while allowing product to reach room temperature. Do not use if solution is discolored or contains particulate matter. Do not administer to skin which is red, tender, bruised, hard, or that has scars, stretch marks, or psoriasis plaques. Needle cap of the prefilled syringe or needle cover for the adalimumab pen may contain latex. Prefilled pens and syringes are available for use by patients and the full amount of the syringe should be injected (self-administration); the vial is intended for institutional use only. Vials do not contain a preservative; discard unused portion. No recommendations for changes at this time.   Benard Halsted, PharmD, [place], [place] [phone]

## 2020-10-02 MED FILL — HUMIRA PEN 40 MG/0.8ML PNKT: 40 | 28 days supply | Qty: 2 | Fill #0

## 2020-10-21 ENCOUNTER — Other Ambulatory Visit (HOSPITAL_COMMUNITY): Payer: Self-pay

## 2020-10-27 ENCOUNTER — Ambulatory Visit (HOSPITAL_COMMUNITY)
Admission: EM | Admit: 2020-10-27 | Discharge: 2020-10-27 | Disposition: A | Discharge status: Home / Self Care | Payer: No Typology Code available for payment source | Attending: Emergency Medicine | Admitting: Emergency Medicine

## 2020-10-27 ENCOUNTER — Other Ambulatory Visit: Payer: Self-pay

## 2020-10-27 ENCOUNTER — Other Ambulatory Visit (HOSPITAL_COMMUNITY): Payer: Self-pay

## 2020-10-27 ENCOUNTER — Encounter (HOSPITAL_COMMUNITY): Payer: Self-pay

## 2020-10-27 ENCOUNTER — Ambulatory Visit (INDEPENDENT_AMBULATORY_CARE_PROVIDER_SITE_OTHER): Payer: No Typology Code available for payment source

## 2020-10-27 ENCOUNTER — Other Ambulatory Visit (HOSPITAL_BASED_OUTPATIENT_CLINIC_OR_DEPARTMENT_OTHER): Payer: Self-pay

## 2020-10-27 DIAGNOSIS — M5441 Lumbago with sciatica, right side: Secondary | ICD-10-CM | POA: Diagnosis not present

## 2020-10-27 DIAGNOSIS — M545 Low back pain, unspecified: Secondary | ICD-10-CM | POA: Diagnosis not present

## 2020-10-27 DIAGNOSIS — M5431 Sciatica, right side: Secondary | ICD-10-CM

## 2020-10-27 HISTORY — DX: Spondylosis, unspecified: M47.9

## 2020-10-27 LAB — POCT URINALYSIS DIPSTICK, ED / UC
Bilirubin Urine: NEGATIVE
Glucose, UA: NEGATIVE mg/dL
Hgb urine dipstick: NEGATIVE
Ketones, ur: NEGATIVE mg/dL
Leukocytes,Ua: NEGATIVE
Nitrite: NEGATIVE
Protein, ur: NEGATIVE mg/dL
Specific Gravity, Urine: 1.02 (ref 1.005–1.030)
Urobilinogen, UA: 0.2 mg/dL (ref 0.0–1.0)
pH: 5.5 (ref 5.0–8.0)

## 2020-10-27 LAB — POC URINE PREG, ED: Preg Test, Ur: NEGATIVE

## 2020-10-27 MED ORDER — PREDNISONE 10 MG PO TABS
20.0000 mg | ORAL_TABLET | Freq: Every day | ORAL | 0 refills | Status: DC
  Filled 2020-10-27 (×2): qty 15, 7d supply, fill #0

## 2020-10-27 MED ORDER — NAPROXEN 500 MG PO TABS
500.0000 mg | ORAL_TABLET | Freq: Two times a day (BID) | ORAL | 0 refills | Status: DC
  Filled 2020-10-27 (×2): qty 30, 15d supply, fill #0

## 2020-10-27 NOTE — ED Provider Notes (Signed)
Jamie Melton    CSN: [id number] Arrival date & time: 10/27/20  3704      History   Chief Complaint Chief Complaint  Patient presents with  . Back Pain    HPI Jamie Melton is a 50 y.o. female.   Patient here for evaluation of right sided lower back pain that started yesterday.  Reports taking Motrin yesterday with some relief but pain is worse today.  Reports pain specifically to the right side and radiates down into right buttock and hip.  Reports mowing the lawn on Monday but otherwise no precipitating of event or trauma.  Denies any numbness or tingling in her lower extremities and no loss of bowel or bladder control.  Denies any fevers, chest pain, shortness of breath, N/V/D, numbness, tingling, weakness, abdominal pain, or headaches.   ROS: As per HPI, all other pertinent ROS negative  The history is provided by the patient.  Back Pain   Past Medical History:  Diagnosis Date  . Asthma    DR Carmelina Peal  . Fibroid 2012  . Headache(784.0)    FREQUENT  . Infection    UTI  . Pain in joint, multiple sites   . Spondylosis   . Unspecified abortion, complicated by metabolic disorder, unspecified     Patient Active Problem List   Diagnosis Date Noted  . Vaginal delivery 04/01/2013  . Sterilization 03/30/2013  . PROM (premature rupture of membranes) at term 03/29/2013  . Thrombocytopenia, unspecified ([place]) 03/29/2013  . Chest pain 08/02/2011  . Asthma 08/02/2011  . Palpable abd. aorta 08/02/2011    Past Surgical History:  Procedure Laterality Date  . APPENDECTOMY    . BILATERAL SALPINGECTOMY Bilateral 03/30/2013   Procedure: BILATERAL SALPINGECTOMY;  Surgeon: Eldred Manges, MD;  Location: [facility];  Service: Obstetrics;  Laterality: Bilateral;  . CESAREAN SECTION N/A 03/30/2013   Procedure: Primary CESAREAN SECTION of baby  boy at [facility] 8/9;  Surgeon: Eldred Manges, MD;  Location: [facility];  Service: Obstetrics;  Laterality: N/A;  . NASAL SINUS SURGERY       OB History    Gravida  2   Para  1   Term  1   Preterm      AB  1   Living  1     SAB  1   IAB      Ectopic      Multiple      Live Births  1            Home Medications    Prior to Admission medications   Medication Sig Start Date End Date Taking? Authorizing Provider  naproxen (NAPROSYN) 500 MG tablet Take 1 tablet (500 mg total) by mouth 2 (two) times daily. 10/27/20  Yes Pearson Forster, NP  predniSONE (DELTASONE) 10 MG tablet Take 2 tablets (20 mg total) by mouth daily. 10/27/20  Yes Pearson Forster, NP  Adalimumab 40 MG/0.8ML PNKT INJECT 40 MG INTO THE SKIN EVERY 14 (FOURTEEN) DAYS. 10/01/20 10/01/21  Tresa Garter, MD  Adalimumab 40 MG/0.8ML PNKT INJECT 40MG  UNDER THE SKIN EVERY 14 DAYS 10/01/20 10/01/21  Lahoma Rocker, MD  albuterol (PROVENTIL,VENTOLIN) 90 MCG/ACT inhaler Inhale 2 puffs into the lungs every 6 (six) hours as needed for wheezing or shortness of breath.     [provider]  albuterol (VENTOLIN HFA) 108 (90 Base) MCG/ACT inhaler Inhale 1-2 puffs into the lungs every 6 (six) hours as needed for wheezing or shortness  of breath. 08/09/19   Jaynee Eagles, PA-C  albuterol (VENTOLIN HFA) 108 (90 Base) MCG/ACT inhaler INHALE 2 PUFFS EVERY 6 HOURS AS NEEDED 12/15/19 12/14/20  Leeroy Cha, MD  ARNUITY ELLIPTA 100 MCG/ACT AEPB Inhale 1 puff into the lungs at bedtime. 02/11/16   [provider]  cyclobenzaprine (FLEXERIL) 10 MG tablet Take 1 tablet (10 mg total) by mouth 2 (two) times daily as needed for muscle spasms. 03/16/16   Montine Circle, PA-C  HYDROcodone-acetaminophen (NORCO/VICODIN) 5-325 MG tablet Take 2 tablets by mouth every 6 (six) hours as needed for severe pain. 03/16/16   Montine Circle, PA-C  hydrOXYzine (ATARAX/VISTARIL) 10 MG tablet Take 10 mg by mouth 4 (four) times daily as needed for itching. 02/11/16   [provider]  ibuprofen (ADVIL,MOTRIN) 600 MG tablet Take 1 tablet (600 mg total) by mouth  every 6 (six) hours as needed for pain. Patient not taking: Reported on 03/16/2016 04/02/13   Everett Graff, MD  isoniazid (NYDRAZID) 300 MG tablet Take 3 tablets (900 mg total) by mouth once a week. 04/09/19   Carlyle Basques, MD  levocetirizine (XYZAL) 5 MG tablet Take 5 mg by mouth at bedtime. 02/11/16   [provider]  Multiple Vitamin (MULTIVITAMIN WITH MINERALS) TABS tablet Take 1 tablet by mouth daily.    [provider]  oxyCODONE-acetaminophen (PERCOCET/ROXICET) 5-325 MG per tablet Take 1-2 tablets by mouth every 6 (six) hours as needed. Patient not taking: No sig reported 04/02/13   Everett Graff, MD  pyridOXINE (VITAMIN B-6) 50 MG tablet Take 1 tablet (50 mg total) by mouth daily. 04/09/19   Carlyle Basques, MD  QVAR REDIHALER 80 MCG/ACT inhaler  12/14/18   [provider]    Family History Family History  Problem Relation Age of Onset  . Arthritis Mother   . Kidney disease Mother        STONES  . Thyroid disease Mother   . Hypertension Father     Social History Social History   Tobacco Use  . Smoking status: Never Smoker  . Smokeless tobacco: Never Used  Vaping Use  . Vaping Use: Never used  Substance Use Topics  . Alcohol use: Yes    Comment: occasional glass of wine  . Drug use: No     Allergies   Patient has no known allergies.   Review of Systems Review of Systems  Musculoskeletal: Positive for back pain.  All other systems reviewed and are negative.    Physical Exam Triage Vital Signs ED Triage Vitals  Enc Vitals Group     BP 10/27/20 0905 120/65     Pulse Rate 10/27/20 0905 78     Resp 10/27/20 0905 17     Temp 10/27/20 0905 98.9 F (37.2 C)     Temp src --      SpO2 10/27/20 0905 100 %     Weight --      Height --      Head Circumference --      Peak Flow --      Pain Score 10/27/20 0900 7     Pain Loc --      Pain Edu? --      Excl. in [place]? --    No data found.  Updated Vital Signs BP 120/65   Pulse 78    Temp 98.9 F (37.2 C)   Resp 17   LMP 09/25/2020   SpO2 100%   Breastfeeding No   Visual Acuity Right Eye  Distance:   Left Eye Distance:   Bilateral Distance:    Right Eye Near:   Left Eye Near:    Bilateral Near:     Physical Exam Vitals and nursing note reviewed.  Constitutional:      General: She is not in acute distress.    Appearance: Normal appearance. She is not ill-appearing, toxic-appearing or diaphoretic.  HENT:     Head: Normocephalic and atraumatic.  Eyes:     Conjunctiva/sclera: Conjunctivae normal.  Cardiovascular:     Rate and Rhythm: Normal rate.     Pulses: Normal pulses.  Pulmonary:     Effort: Pulmonary effort is normal.  Abdominal:     General: Abdomen is flat.     Tenderness: There is no right CVA tenderness or left CVA tenderness.  Musculoskeletal:     Cervical back: Normal range of motion.     Lumbar back: Tenderness (right sided) present. No swelling, edema, deformity, signs of trauma or bony tenderness. Normal range of motion. Positive right straight leg raise test.  Skin:    General: Skin is warm and dry.  Neurological:     General: No focal deficit present.     Mental Status: She is alert and oriented to person, place, and time.  Psychiatric:        Mood and Affect: Mood normal.      UC Treatments / Results  Labs (all labs ordered are listed, but only abnormal results are displayed) Labs Reviewed  POC URINE PREG, ED  POCT URINALYSIS DIPSTICK, ED / UC    EKG   Radiology DG Lumbar Spine Complete  Result Date: 10/27/2020 CLINICAL DATA:  Low back pain EXAM: LUMBAR SPINE - COMPLETE 4+ VIEW COMPARISON:  None. FINDINGS: Frontal, lateral, spot lumbosacral lateral, and bilateral oblique views were obtained. There are 5 non-rib-bearing lumbar type vertebral bodies. There is mild thoracolumbar levoscoliosis. There is no fracture or spondylolisthesis. There is moderate disc space narrowing at L5-S1. Other disc spaces appear unremarkable.  There is mild facet osteoarthritic change at L5-S1 bilaterally. Facets at other levels appear normal. IMPRESSION: Mild scoliosis. Osteoarthritic change at L5-S1. Other disc spaces appear unremarkable. No fracture or spondylolisthesis. Electronically Signed   By: Lowella Grip III M.D.   On: 10/27/2020 09:42    Procedures Procedures (including critical care time)  Medications Ordered in UC Medications - No data to display  Initial Impression / Assessment and Plan / UC Course  I have reviewed the triage vital signs and the nursing notes.  Pertinent labs & imaging results that were available during my care of the patient were reviewed by me and considered in my medical decision making (see chart for details).     Acute right-sided low back pain, sciatica of the right side Assessment negative for red flags or concerns X-ray with mild scoliosis and osteoarthritic change at L5-S1, no fractures or spondylolisthesis. Prednisone daily x5 days. Naproxen as needed for pain. Encourage rest and heat/ice therapy for comfort. Follow-up with primary care  Final Clinical Impressions(s) / UC Diagnoses   Final diagnoses:  Acute right-sided low back pain with right-sided sciatica  Sciatica of right side     Discharge Instructions     Take the prednisone once a day for the next 5 days.   You can also take naproxen as needed for pain.   Apply heat and ice to your back for comfort.    Rest for the next few days.   Return or go to the Emergency  Department if symptoms worsen or do not improve in the next few days.     ED Prescriptions    Medication Sig Dispense Auth. Provider   predniSONE (DELTASONE) 10 MG tablet Take 2 tablets (20 mg total) by mouth daily. 15 tablet Caroll Rancher R, NP   naproxen (NAPROSYN) 500 MG tablet Take 1 tablet (500 mg total) by mouth 2 (two) times daily. 30 tablet Pearson Forster, NP     PDMP not reviewed this encounter.   Pearson Forster, NP 10/27/20  1013

## 2020-10-27 NOTE — Discharge Instructions (Addendum)
Take the prednisone once a day for the next 5 days.   You can also take naproxen as needed for pain.   Apply heat and ice to your back for comfort.    Rest for the next few days.   Return or go to the Emergency Department if symptoms worsen or do not improve in the next few days.

## 2020-10-27 NOTE — ED Triage Notes (Signed)
Pt in with c/o lower back pain that started yesterday.  Pt has taken ibuprofen with no relief Denies any strenuous activity

## 2020-11-01 ENCOUNTER — Other Ambulatory Visit (HOSPITAL_COMMUNITY): Payer: Self-pay

## 2020-11-01 MED FILL — Adalimumab Auto-injector Kit 40 MG/0.8ML: SUBCUTANEOUS | 28 days supply | Qty: 2 | Fill #0 | Status: AC

## 2020-11-02 ENCOUNTER — Other Ambulatory Visit (HOSPITAL_COMMUNITY): Payer: Self-pay

## 2020-11-24 ENCOUNTER — Other Ambulatory Visit (HOSPITAL_COMMUNITY): Payer: Self-pay

## 2020-11-29 MED FILL — Albuterol Sulfate Inhal Aero 108 MCG/ACT (90MCG Base Equiv): RESPIRATORY_TRACT | 25 days supply | Qty: 8.5 | Fill #0 | Status: AC

## 2020-11-30 ENCOUNTER — Other Ambulatory Visit (HOSPITAL_COMMUNITY): Payer: Self-pay

## 2020-12-07 ENCOUNTER — Other Ambulatory Visit (HOSPITAL_COMMUNITY): Payer: Self-pay

## 2020-12-15 ENCOUNTER — Other Ambulatory Visit (HOSPITAL_COMMUNITY): Payer: Self-pay

## 2020-12-15 MED ORDER — OMEPRAZOLE 20 MG PO CPDR
20.0000 mg | DELAYED_RELEASE_CAPSULE | Freq: Every day | ORAL | 0 refills | Status: DC
  Filled 2020-12-15: qty 30, 30d supply, fill #0

## 2020-12-27 ENCOUNTER — Other Ambulatory Visit (HOSPITAL_COMMUNITY): Payer: Self-pay

## 2020-12-27 MED FILL — Adalimumab Auto-injector Kit 40 MG/0.8ML: SUBCUTANEOUS | 28 days supply | Qty: 2 | Fill #1 | Status: AC

## 2020-12-28 ENCOUNTER — Other Ambulatory Visit (HOSPITAL_COMMUNITY): Payer: Self-pay

## 2020-12-28 ENCOUNTER — Other Ambulatory Visit: Payer: Self-pay | Admitting: Gastroenterology

## 2020-12-28 DIAGNOSIS — R1012 Left upper quadrant pain: Secondary | ICD-10-CM

## 2020-12-28 DIAGNOSIS — R1013 Epigastric pain: Secondary | ICD-10-CM

## 2020-12-28 MED ORDER — PANTOPRAZOLE SODIUM 40 MG PO TBEC
40.0000 mg | DELAYED_RELEASE_TABLET | Freq: Every day | ORAL | 1 refills | Status: DC
  Filled 2020-12-28: qty 90, 90d supply, fill #0
  Filled 2021-05-16: qty 90, 90d supply, fill #1

## 2020-12-30 ENCOUNTER — Other Ambulatory Visit (HOSPITAL_COMMUNITY): Payer: Self-pay

## 2021-01-10 ENCOUNTER — Ambulatory Visit
Admission: RE | Admit: 2021-01-10 | Discharge: 2021-01-10 | Disposition: A | Discharge status: Home / Self Care | Payer: No Typology Code available for payment source | Source: Ambulatory Visit | Attending: Gastroenterology | Admitting: Gastroenterology

## 2021-01-10 DIAGNOSIS — R1013 Epigastric pain: Secondary | ICD-10-CM

## 2021-01-10 DIAGNOSIS — R1012 Left upper quadrant pain: Secondary | ICD-10-CM

## 2021-01-11 ENCOUNTER — Other Ambulatory Visit: Payer: Self-pay

## 2021-01-11 ENCOUNTER — Other Ambulatory Visit (HOSPITAL_COMMUNITY): Payer: Self-pay

## 2021-01-12 ENCOUNTER — Other Ambulatory Visit (HOSPITAL_COMMUNITY): Payer: Self-pay

## 2021-01-14 ENCOUNTER — Other Ambulatory Visit (HOSPITAL_COMMUNITY): Payer: Self-pay

## 2021-01-14 ENCOUNTER — Other Ambulatory Visit: Payer: Self-pay

## 2021-01-14 MED ORDER — ALBUTEROL SULFATE HFA 108 (90 BASE) MCG/ACT IN AERS
2.0000 | INHALATION_SPRAY | Freq: Four times a day (QID) | RESPIRATORY_TRACT | 5 refills | Status: DC | PRN
  Filled 2021-01-14: qty 8.5, 25d supply, fill #0
  Filled 2021-03-16: qty 8.5, 25d supply, fill #1
  Filled 2021-04-19: qty 8.5, 25d supply, fill #2
  Filled 2021-06-15: qty 8.5, 25d supply, fill #3

## 2021-01-17 ENCOUNTER — Other Ambulatory Visit: Payer: Self-pay

## 2021-01-17 ENCOUNTER — Other Ambulatory Visit (HOSPITAL_COMMUNITY): Payer: Self-pay

## 2021-01-18 ENCOUNTER — Other Ambulatory Visit: Payer: Self-pay

## 2021-01-18 ENCOUNTER — Other Ambulatory Visit (HOSPITAL_COMMUNITY): Payer: Self-pay

## 2021-01-19 ENCOUNTER — Other Ambulatory Visit (HOSPITAL_COMMUNITY): Payer: Self-pay

## 2021-01-19 ENCOUNTER — Other Ambulatory Visit: Payer: Self-pay

## 2021-01-20 ENCOUNTER — Other Ambulatory Visit (HOSPITAL_COMMUNITY): Payer: Self-pay

## 2021-01-26 ENCOUNTER — Other Ambulatory Visit: Payer: Self-pay

## 2021-01-26 ENCOUNTER — Other Ambulatory Visit (HOSPITAL_COMMUNITY): Payer: Self-pay

## 2021-01-27 ENCOUNTER — Other Ambulatory Visit (HOSPITAL_COMMUNITY): Payer: Self-pay

## 2021-01-27 ENCOUNTER — Other Ambulatory Visit: Payer: Self-pay | Admitting: Pharmacist

## 2021-01-27 MED ORDER — HUMIRA PEN 40 MG/0.8ML ~~LOC~~ PNKT
PEN_INJECTOR | SUBCUTANEOUS | 3 refills | Status: DC
  Filled 2021-01-27: qty 2, 28d supply, fill #0

## 2021-01-27 MED ORDER — HUMIRA PEN 40 MG/0.8ML ~~LOC~~ PNKT
PEN_INJECTOR | SUBCUTANEOUS | 3 refills | Status: DC
  Filled 2021-01-27 (×4): qty 2, 28d supply, fill #0
  Filled 2021-02-21: qty 2, 28d supply, fill #1
  Filled 2021-03-30: qty 2, 28d supply, fill #2
  Filled 2021-04-27 – 2021-05-09 (×2): qty 2, 28d supply, fill #3

## 2021-01-28 ENCOUNTER — Other Ambulatory Visit: Payer: No Typology Code available for payment source

## 2021-02-21 ENCOUNTER — Other Ambulatory Visit (HOSPITAL_COMMUNITY): Payer: Self-pay

## 2021-03-03 ENCOUNTER — Other Ambulatory Visit (HOSPITAL_COMMUNITY): Payer: Self-pay

## 2021-03-16 ENCOUNTER — Other Ambulatory Visit (HOSPITAL_COMMUNITY): Payer: Self-pay

## 2021-03-30 ENCOUNTER — Other Ambulatory Visit (HOSPITAL_COMMUNITY): Payer: Self-pay

## 2021-04-04 ENCOUNTER — Other Ambulatory Visit (HOSPITAL_COMMUNITY): Payer: Self-pay

## 2021-04-12 ENCOUNTER — Other Ambulatory Visit (HOSPITAL_COMMUNITY): Payer: Self-pay

## 2021-04-12 MED ORDER — PEG 3350-KCL-NA BICARB-NACL 420 G PO SOLR
ORAL | 0 refills | Status: DC
  Filled 2021-04-12: qty 4000, 1d supply, fill #0

## 2021-04-20 ENCOUNTER — Other Ambulatory Visit (HOSPITAL_COMMUNITY): Payer: Self-pay

## 2021-04-23 LAB — HM COLONOSCOPY

## 2021-04-27 ENCOUNTER — Other Ambulatory Visit (HOSPITAL_COMMUNITY): Payer: Self-pay

## 2021-05-04 ENCOUNTER — Other Ambulatory Visit (HOSPITAL_COMMUNITY): Payer: Self-pay

## 2021-05-09 ENCOUNTER — Other Ambulatory Visit (HOSPITAL_COMMUNITY): Payer: Self-pay

## 2021-05-09 MED ORDER — HUMIRA PEN 40 MG/0.8ML ~~LOC~~ PNKT
40.0000 mg | PEN_INJECTOR | SUBCUTANEOUS | 3 refills | Status: DC
  Filled 2021-05-09: qty 2, 28d supply, fill #0

## 2021-05-11 ENCOUNTER — Other Ambulatory Visit (HOSPITAL_COMMUNITY): Payer: Self-pay

## 2021-05-16 ENCOUNTER — Other Ambulatory Visit (HOSPITAL_COMMUNITY): Payer: Self-pay

## 2021-05-27 ENCOUNTER — Other Ambulatory Visit (HOSPITAL_COMMUNITY): Payer: Self-pay

## 2021-05-30 ENCOUNTER — Other Ambulatory Visit: Payer: Self-pay | Admitting: Pharmacist

## 2021-05-30 ENCOUNTER — Other Ambulatory Visit (HOSPITAL_COMMUNITY): Payer: Self-pay

## 2021-05-30 MED ORDER — HUMIRA PEN 40 MG/0.8ML ~~LOC~~ PNKT
40.0000 mg | PEN_INJECTOR | SUBCUTANEOUS | 3 refills | Status: DC
  Filled 2021-05-30: qty 2, 28d supply, fill #0

## 2021-06-06 ENCOUNTER — Other Ambulatory Visit (HOSPITAL_COMMUNITY): Payer: Self-pay

## 2021-06-13 ENCOUNTER — Other Ambulatory Visit (HOSPITAL_COMMUNITY): Payer: Self-pay

## 2021-06-13 MED ORDER — PREDNISONE 5 MG PO TABS
ORAL_TABLET | ORAL | 0 refills | Status: AC
  Filled 2021-06-13: qty 42, 12d supply, fill #0

## 2021-06-15 ENCOUNTER — Other Ambulatory Visit (HOSPITAL_COMMUNITY): Payer: Self-pay

## 2021-06-15 ENCOUNTER — Other Ambulatory Visit: Payer: Self-pay

## 2021-06-15 ENCOUNTER — Ambulatory Visit: Payer: No Typology Code available for payment source | Attending: Family Medicine | Admitting: Pharmacist

## 2021-06-15 ENCOUNTER — Telehealth: Payer: Self-pay | Admitting: Pharmacist

## 2021-06-15 DIAGNOSIS — Z79899 Other long term (current) drug therapy: Secondary | ICD-10-CM

## 2021-06-15 MED ORDER — ENBREL SURECLICK 50 MG/ML ~~LOC~~ SOAJ
50.0000 mg | SUBCUTANEOUS | 3 refills | Status: DC
  Filled 2021-06-15 – 2021-06-28 (×2): qty 4, 28d supply, fill #0
  Filled 2021-07-22: qty 4, 28d supply, fill #1
  Filled 2021-08-18: qty 4, 28d supply, fill #2
  Filled 2021-09-09: qty 4, 28d supply, fill #3

## 2021-06-15 MED ORDER — ENBREL SURECLICK 50 MG/ML ~~LOC~~ SOAJ
50.0000 mg | SUBCUTANEOUS | 3 refills | Status: DC
  Filled 2021-06-15: qty 4, 28d supply, fill #0

## 2021-06-15 NOTE — Telephone Encounter (Signed)
Called patient to schedule an appointment for the [facility]. I was unable to reach the patient so I left a HIPAA-compliant message requesting that the patient return my call.   Luke Van Ausdall, PharmD, BCACP, CPP Clinical Pharmacist [facility] [phone]  

## 2021-06-15 NOTE — Progress Notes (Signed)
   S: Patient presents for review of their specialty medication therapy.  Patient is currently taking Enbrel for ankylosing spondylitis. Patient is managed by Dr. Kathlene November for this.   Adherence: has not yet started    Efficacy: has not yet started   Dosing:   Ankylosing spondylitis, psoriatic arthritis, rheumatoid arthritis: SubQ: Note: May continue methotrexate, glucocorticoids, salicylates, NSAIDs, or analgesics during etanercept therapy. Once-weekly dosing: 50 mg once weekly; maximum dose (rheumatoid arthritis): 50 mg/week. Twice-weekly dosing (off-label dose): 25 mg twice weekly (Bathon 2000; Calin 2004; Rosana Hoes 2003; Genovese 2002; Mease 2000; Mease 2002)  Screening: TB test: completed  Hepatitis B: completed   Monitoring: Injection site reactions: not yet started  S/sx of infections: not yet started  S/sx of malignancy: not yet started  GI upset: not yet started   O: Lab Results  Component Value Date   WBC 6.2 04/09/2019   HGB 11.0 (L) 04/09/2019   HCT 32.9 (L) 04/09/2019   MCV 94.8 04/09/2019   PLT 232 04/09/2019      Chemistry      Component Value Date/Time   NA 140 05/21/2019 1443   K 4.0 05/21/2019 1443   CL 106 05/21/2019 1443   CO2 26 05/21/2019 1443   BUN 11 05/21/2019 1443   CREATININE 0.76 05/21/2019 1443      Component Value Date/Time   CALCIUM 9.3 05/21/2019 1443   AST 16 05/21/2019 1443   ALT 11 05/21/2019 1443   BILITOT 0.3 05/21/2019 1443       A/P: 1. Medication review: patient currently prescribed Enbrel for ankylosing spondylitis. Reviewed the medication with the patient, including the following: Enbrel (etanercept) binds tumor necrosis factor (TNF) and blocks its interaction with cell surface receptors. TNF plays an important role in the inflammatory processes of many diseases. Patient educated on purpose, proper use and potential adverse effects of Enbrel. SubQ: Administer subcutaneously. Rotate injection sites; may inject into the thigh  (preferred), abdomen (avoiding the 2-inch area around the navel), or outer areas of upper arm. New injections should be given at least 1 inch from an old site and never into areas where the skin is tender, bruised, red, or hard; any raised thick, red, or scaly skin patches or lesions; or areas with scars or stretch marks. For a more comfortable injection, allow autoinjectors, prefilled syringes, and dose trays to reach room temperature for 15 to 30 minutes (?30 minutes for autoinjector) prior to injection; do not remove the needle cover while allowing product to reach room temperature. There may be small white particles of protein in the solution; this is not unusual for proteinaceous solutions. Possible adverse effects include rash, GI upset, increased risk of infection, and injection site reactions. Patients should stop Enbrel if they develop a serious infection. There is a possible increased risk in lymphoma and other malignancies. No recommendations for any changes.   Benard Halsted, PharmD, [facility] [place], [place] [phone]

## 2021-06-27 ENCOUNTER — Other Ambulatory Visit (HOSPITAL_COMMUNITY): Payer: Self-pay

## 2021-06-28 ENCOUNTER — Other Ambulatory Visit (HOSPITAL_COMMUNITY): Payer: Self-pay

## 2021-07-01 ENCOUNTER — Other Ambulatory Visit: Payer: Self-pay | Admitting: Gastroenterology

## 2021-07-01 DIAGNOSIS — R1012 Left upper quadrant pain: Secondary | ICD-10-CM

## 2021-07-08 ENCOUNTER — Other Ambulatory Visit (HOSPITAL_COMMUNITY): Payer: Self-pay

## 2021-07-08 MED ORDER — ALBUTEROL SULFATE HFA 108 (90 BASE) MCG/ACT IN AERS
2.0000 | INHALATION_SPRAY | Freq: Four times a day (QID) | RESPIRATORY_TRACT | 5 refills | Status: DC | PRN
  Filled 2021-07-08: qty 8.5, 25d supply, fill #0
  Filled 2021-08-23: qty 8.5, 25d supply, fill #1
  Filled 2021-10-09 – 2021-10-18 (×2): qty 8.5, 25d supply, fill #2
  Filled 2021-12-05: qty 8.5, 25d supply, fill #3
  Filled 2022-01-29: qty 8.5, 25d supply, fill #4
  Filled 2022-04-17: qty 8.5, 25d supply, fill #5

## 2021-07-22 ENCOUNTER — Other Ambulatory Visit (HOSPITAL_COMMUNITY): Payer: Self-pay

## 2021-07-26 ENCOUNTER — Ambulatory Visit
Admission: RE | Admit: 2021-07-26 | Discharge: 2021-07-26 | Disposition: A | Discharge status: Home / Self Care | Payer: 59 | Source: Ambulatory Visit | Attending: Gastroenterology | Admitting: Gastroenterology

## 2021-07-26 DIAGNOSIS — R1012 Left upper quadrant pain: Secondary | ICD-10-CM | POA: Diagnosis not present

## 2021-07-26 MED ORDER — IOPAMIDOL (ISOVUE-300) INJECTION 61%
100.0000 mL | Freq: Once | INTRAVENOUS | Status: AC | PRN
  Administered 2021-07-26: 100 mL via INTRAVENOUS

## 2021-08-18 ENCOUNTER — Other Ambulatory Visit (HOSPITAL_COMMUNITY): Payer: Self-pay

## 2021-08-22 ENCOUNTER — Other Ambulatory Visit (HOSPITAL_COMMUNITY): Payer: Self-pay

## 2021-08-23 ENCOUNTER — Other Ambulatory Visit (HOSPITAL_COMMUNITY): Payer: Self-pay

## 2021-08-30 ENCOUNTER — Other Ambulatory Visit: Payer: Self-pay

## 2021-08-30 ENCOUNTER — Ambulatory Visit: Payer: 59 | Admitting: Physician Assistant

## 2021-08-30 ENCOUNTER — Other Ambulatory Visit (HOSPITAL_COMMUNITY): Payer: Self-pay

## 2021-08-30 DIAGNOSIS — J0110 Acute frontal sinusitis, unspecified: Secondary | ICD-10-CM | POA: Diagnosis not present

## 2021-08-30 HISTORY — DX: Acute frontal sinusitis, unspecified: J01.10

## 2021-08-30 MED ORDER — FLUTICASONE PROPIONATE 50 MCG/ACT NA SUSP
2.0000 | Freq: Every day | NASAL | 6 refills | Status: DC
  Filled 2021-08-30: qty 16, 30d supply, fill #0
  Filled 2022-02-06: qty 16, 30d supply, fill #1

## 2021-08-30 MED ORDER — AMOXICILLIN 500 MG PO CAPS
500.0000 mg | ORAL_CAPSULE | Freq: Three times a day (TID) | ORAL | 0 refills | Status: AC
  Filled 2021-08-30: qty 30, 10d supply, fill #0

## 2021-08-30 NOTE — Progress Notes (Signed)
Patient verified DOB Patient states symptoms began 3 weeks ago. Patient reports cough has continued with no N/V/diarrhea or fevers. Patient is able to eat and drink like normal.

## 2021-08-30 NOTE — Progress Notes (Signed)
I agree with plan and assessment  Kennieth Rad, PA-C Physician Assistant [email or web address]

## 2021-08-30 NOTE — Patient Instructions (Addendum)
Please begin taking Amoxicillin 500 mg, three times daily, for 10 days. This will treat your sinus infection.  Please also use Flonase once daily, two puffs in each nostril. This will help treat the inflammation in your sinuses.   Please return to the [facility] if your symptoms do not improve after finishing this course of antibiotics.  Sinusitis, Adult Sinusitis is inflammation of your sinuses. Sinuses are hollow spaces in the bones around your face. Your sinuses are located: Around your eyes. In the middle of your forehead. Behind your nose. In your cheekbones. Mucus normally drains out of your sinuses. When your nasal tissues become inflamed or swollen, mucus can become trapped or blocked. This allows bacteria, viruses, and fungi to grow, which leads to infection. Most infections of the sinuses are caused by a virus. Sinusitis can develop quickly. It can last for up to 4 weeks (acute) or for more than 12 weeks (chronic). Sinusitis often develops after a cold. What are the causes? This condition is caused by anything that creates swelling in the sinuses or stops mucus from draining. This includes: Allergies. Asthma. Infection from bacteria or viruses. Deformities or blockages in your nose or sinuses. Abnormal growths in the nose (nasal polyps). Pollutants, such as chemicals or irritants in the air. Infection from fungi (rare). What increases the risk? You are more likely to develop this condition if you: Have a weak body defense system (immune system). Do a lot of swimming or diving. Overuse nasal sprays. Smoke. What are the signs or symptoms? The main symptoms of this condition are pain and a feeling of pressure around the affected sinuses. Other symptoms include: Stuffy nose or congestion. Thick drainage from your nose. Swelling and warmth over the affected sinuses. Headache. Upper toothache. A cough that may get worse at night. Extra mucus that collects in the  throat or the back of the nose (postnasal drip). Decreased sense of smell and taste. Fatigue. A fever. Sore throat. Bad breath. How is this diagnosed? This condition is diagnosed based on: Your symptoms. Your medical history. A physical exam. Tests to find out if your condition is acute or chronic. This may include: Checking your nose for nasal polyps. Viewing your sinuses using a device that has a light (endoscope). Testing for allergies or bacteria. Imaging tests, such as an MRI or CT scan. In rare cases, a bone biopsy may be done to rule out more serious types of fungal sinus disease. How is this treated? Treatment for sinusitis depends on the cause and whether your condition is chronic or acute. If caused by a virus, your symptoms should go away on their own within 10 days. You may be given medicines to relieve symptoms. They include: Medicines that shrink swollen nasal passages (topical intranasal decongestants). Medicines that treat allergies (antihistamines). A spray that eases inflammation of the nostrils (topical intranasal corticosteroids). Rinses that help get rid of thick mucus in your nose (nasal saline washes). If caused by bacteria, your health care provider may recommend waiting to see if your symptoms improve. Most bacterial infections will get better without antibiotic medicine. You may be given antibiotics if you have: A severe infection. A weak immune system. If caused by narrow nasal passages or nasal polyps, you may need to have surgery. Follow these instructions at home: Medicines Take, use, or apply over-the-counter and prescription medicines only as told by your health care provider. These may include nasal sprays. If you were prescribed an antibiotic medicine, take it as told  by your health care provider. Do not stop taking the antibiotic even if you start to feel better. Hydrate and humidify  Drink enough fluid to keep your urine pale yellow. Staying  hydrated will help to thin your mucus. Use a cool mist humidifier to keep the humidity level in your home above 50%. Inhale steam for 10-15 minutes, 3-4 times a day, or as told by your health care provider. You can do this in the bathroom while a hot shower is running. Limit your exposure to cool or dry air. Rest Rest as much as possible. Sleep with your head raised (elevated). Make sure you get enough sleep each night. General instructions  Apply a warm, moist washcloth to your face 3-4 times a day or as told by your health care provider. This will help with discomfort. Wash your hands often with soap and water to reduce your exposure to germs. If soap and water are not available, use hand sanitizer. Do not smoke. Avoid being around people who are smoking (secondhand smoke). Keep all follow-up visits as told by your health care provider. This is important. Contact a health care provider if: You have a fever. Your symptoms get worse. Your symptoms do not improve within 10 days. Get help right away if: You have a severe headache. You have persistent vomiting. You have severe pain or swelling around your face or eyes. You have vision problems. You develop confusion. Your neck is stiff. You have trouble breathing. Summary Sinusitis is soreness and inflammation of your sinuses. Sinuses are hollow spaces in the bones around your face. This condition is caused by nasal tissues that become inflamed or swollen. The swelling traps or blocks the flow of mucus. This allows bacteria, viruses, and fungi to grow, which leads to infection. If you were prescribed an antibiotic medicine, take it as told by your health care provider. Do not stop taking the antibiotic even if you start to feel better. Keep all follow-up visits as told by your health care provider. This is important. This information is not intended to replace advice given to you by your health care provider. Make sure you discuss any  questions you have with your health care provider. Document Revised: 12/10/2017 Document Reviewed: 12/10/2017 Elsevier Patient Education  2022 [employer].

## 2021-08-30 NOTE — Progress Notes (Signed)
Established Patient Office Visit  Subjective:  Patient ID: Jamie Melton, female    DOB: 03-19-1971  Age: 51 y.o. MRN: [id number]  CC:  Chief Complaint  Patient presents with   URI    Virtual Visit via Telephone Note  I connected with Jamie Melton on 08/30/21 at  2:40 PM EST by telephone and verified that I am speaking with the correct person using two identifiers.  Location: Patient: car Provider: Mobile Medicine Unit   I discussed the limitations, risks, security and privacy concerns of performing an evaluation and management service by telephone and the availability of in person appointments. I also discussed with the patient that there may be a patient responsible charge related to this service. The patient expressed understanding and agreed to proceed.   History of Present Illness:   Jamie Melton presents via telephone visit with sinus congestion. She states this began three weeks ago. She denies sick contacts. Home COVID test was negative.   Her symptoms have been worse on the left side of her face. The congestion has been accompanied by nasal discharge that is thick and yellow-green in color. She also has had sore throat, post-nasal drip, and left ear pain. She has a history of asthma, and has had a cough and more frequent wheezing and shortness of breath for the past three weeks. She has been using her rescue inhaler more frequently, now up to twice per day. She finds herself waking up needing to use the inhaler. She denies chest pain, fevers, nausea, vomiting, and diarrhea. She admits to eye itching and redness, which she thinks is likely due to seasonal allergies, but may be related to this issue.   She has tried using over the counter pseudoephederine for this issue, but only used it for about three days because it prevented her from sleeping.     Observations/Objective:  Medical history and current medications reviewed, no physical exam completed.  Past  Medical History:  Diagnosis Date   Asthma    DR [place]   Fibroid 2012   Headache(784.0)    FREQUENT   Infection    UTI   Pain in joint, multiple sites    Spondylosis    Unspecified abortion, complicated by metabolic disorder, unspecified     Past Surgical History:  Procedure Laterality Date   APPENDECTOMY     BILATERAL SALPINGECTOMY Bilateral 03/30/2013   Procedure: BILATERAL SALPINGECTOMY;  Surgeon: Eldred Manges, MD;  Location: [facility];  Service: Obstetrics;  Laterality: Bilateral;   CESAREAN SECTION N/A 03/30/2013   Procedure: Primary CESAREAN SECTION of baby  boy at [facility] 8/9;  Surgeon: Eldred Manges, MD;  Location: [facility];  Service: Obstetrics;  Laterality: N/A;   NASAL SINUS SURGERY      Family History  Problem Relation Age of Onset   Arthritis Mother    Kidney disease Mother        STONES   Thyroid disease Mother    Hypertension Father     Social History   Socioeconomic History   Marital status: Single    Spouse name: Not on file   Number of children: Not on file   Years of education: 20+   Highest education level: Not on file  Occupational History   Occupation: REGISTERED NURSE    Employer: [employer]  Tobacco Use   Smoking status: Never   Smokeless tobacco: Never  Vaping Use   Vaping Use: Never used  Substance and Sexual Activity  Alcohol use: Yes    Comment: occasional glass of wine   Drug use: No   Sexual activity: Yes    Partners: Male    Birth control/protection: None  Other Topics Concern   Not on file  Social History Narrative   Not on file   Social Determinants of Health   Financial Resource Strain: Not on file  Food Insecurity: Not on file  Transportation Needs: Not on file  Physical Activity: Not on file  Stress: Not on file  Social Connections: Not on file  Intimate Partner Violence: Not on file    Outpatient Medications Prior to Visit  Medication Sig Dispense Refill   albuterol (VENTOLIN HFA) 108 (90 Base) MCG/ACT  inhaler Inhale 2 puffs into the lungs every 6 (six) hours as needed. 8.5 g 5   etanercept (ENBREL SURECLICK) 50 MG/ML injection Inject 50 mg into the skin once a week as directed. 4 mL 3   Multiple Vitamin (MULTIVITAMIN WITH MINERALS) TABS tablet Take 1 tablet by mouth daily.     pantoprazole (PROTONIX) 40 MG tablet Take 1 tablet (40 mg total) by mouth daily. 90 tablet 1   polyethylene glycol-electrolytes (NULYTELY) 420 g solution Use as directed 4000 mL 0   albuterol (VENTOLIN HFA) 108 (90 Base) MCG/ACT inhaler Inhale 1-2 puffs into the lungs every 6 (six) hours as needed for wheezing or shortness of breath. 18 g 0   albuterol (PROVENTIL,VENTOLIN) 90 MCG/ACT inhaler Inhale 2 puffs into the lungs every 6 (six) hours as needed for wheezing or shortness of breath.      albuterol (VENTOLIN HFA) 108 (90 Base) MCG/ACT inhaler Inhale 2 puffs into the lungs every 6 (six) hours as needed. 8.5 g 5   ARNUITY ELLIPTA 100 MCG/ACT AEPB Inhale 1 puff into the lungs at bedtime.  3   cyclobenzaprine (FLEXERIL) 10 MG tablet Take 1 tablet (10 mg total) by mouth 2 (two) times daily as needed for muscle spasms. 20 tablet 0   HYDROcodone-acetaminophen (NORCO/VICODIN) 5-325 MG tablet Take 2 tablets by mouth every 6 (six) hours as needed for severe pain. 10 tablet 0   hydrOXYzine (ATARAX/VISTARIL) 10 MG tablet Take 10 mg by mouth 4 (four) times daily as needed for itching.  3   ibuprofen (ADVIL,MOTRIN) 600 MG tablet Take 1 tablet (600 mg total) by mouth every 6 (six) hours as needed for pain. (Patient not taking: Reported on 03/16/2016) 30 tablet 1   isoniazid (NYDRAZID) 300 MG tablet Take 3 tablets (900 mg total) by mouth once a week. 36 tablet 0   levocetirizine (XYZAL) 5 MG tablet Take 5 mg by mouth at bedtime.  3   naproxen (NAPROSYN) 500 MG tablet Take 1 tablet (500 mg total) by mouth 2 (two) times daily. 30 tablet 0   omeprazole (PRILOSEC) 20 MG capsule Take 1 capsule (20 mg total) by mouth daily 30 minutes before  breakfast. 30 capsule 0   oxyCODONE-acetaminophen (PERCOCET/ROXICET) 5-325 MG per tablet Take 1-2 tablets by mouth every 6 (six) hours as needed. (Patient not taking: No sig reported) 30 tablet 0   predniSONE (DELTASONE) 10 MG tablet Take 2 tablets (20 mg total) by mouth daily. 15 tablet 0   pyridOXINE (VITAMIN B-6) 50 MG tablet Take 1 tablet (50 mg total) by mouth daily. 90 tablet 0   QVAR REDIHALER 80 MCG/ACT inhaler      No facility-administered medications prior to visit.    No Known Allergies  ROS Review of Systems  Constitutional:  Negative  for appetite change and fever.  HENT:  Positive for ear pain (itching, discomfort; left side), rhinorrhea, sinus pressure and sore throat. Negative for nosebleeds and trouble swallowing.   Eyes:  Positive for discharge and itching. Negative for visual disturbance.  Respiratory:  Positive for cough and wheezing.   Cardiovascular:  Negative for chest pain and palpitations.  Gastrointestinal:  Negative for diarrhea, nausea and vomiting.  Genitourinary: Negative.   Musculoskeletal: Negative.   Skin: Negative.   Neurological:  Positive for headaches.  Psychiatric/Behavioral:  Positive for sleep disturbance (has to use inhaler in the mornings).      Objective:     There were no vitals taken for this visit. Wt Readings from Last 3 Encounters:  03/29/13 161 lb (73 kg)  02/14/13 155 lb (70.3 kg)  09/19/12 139 lb (63 kg)     Health Maintenance Due  Topic Date Due   Hepatitis C Screening  Never done   PAP SMEAR-Modifier  Never done   COLONOSCOPY (Pts 45-63yrs Insurance coverage will need to be confirmed)  Never done   INFLUENZA VACCINE  02/21/2021    There are no preventive care reminders to display for this patient.  No results found for: TSH Lab Results  Component Value Date   WBC 6.2 04/09/2019   HGB 11.0 (L) 04/09/2019   HCT 32.9 (L) 04/09/2019   MCV 94.8 04/09/2019   PLT 232 04/09/2019   Lab Results  Component Value Date    NA 140 05/21/2019   K 4.0 05/21/2019   CO2 26 05/21/2019   GLUCOSE 94 05/21/2019   BUN 11 05/21/2019   CREATININE 0.76 05/21/2019   BILITOT 0.3 05/21/2019   AST 16 05/21/2019   ALT 11 05/21/2019   PROT 6.5 05/21/2019   CALCIUM 9.3 05/21/2019   ANIONGAP 6 03/16/2016   No results found for: CHOL No results found for: HDL No results found for: LDLCALC No results found for: TRIG No results found for: CHOLHDL No results found for: HGBA1C    Assessment & Plan:   1. Acute non-recurrent frontal sinusitis Since this patient has had these symptoms for three weeks without improvement, it seems likely that this is due to a bacterial sinusitis. Additionally because she is on Enbrel for her ankylosing spondylitis, she is at greater risk for serious bacterial infections. Trial amoxicillin 500 mg, three times daily for 10 days. Additionally, she was advised to use fluticasone intranasal spray daily to treat her nasal congestion and inflammation. Red flag symptoms for prompt return to clinic were reviewed.   - amoxicillin (AMOXIL) 500 MG capsule; Take 1 capsule (500 mg total) by mouth 3 (three) times daily for 10 days.  Dispense: 30 capsule; Refill: 0 - fluticasone (FLONASE) 50 MCG/ACT nasal spray; Place 2 sprays into both nostrils daily.  Dispense: 16 g; Refill: 6   Follow Up Instructions:    I discussed the assessment and treatment plan with the patient. The patient was provided an opportunity to ask questions and all were answered. The patient agreed with the plan and demonstrated an understanding of the instructions.   The patient was advised to call back or seek an in-person evaluation if the symptoms worsen or if the condition fails to improve as anticipated.  I provided 11 minutes of non-face-to-face time during this encounter.   Fontaine No, Student-PA    Meds ordered this encounter  Medications   amoxicillin (AMOXIL) 500 MG capsule    Sig: Take 1 capsule (500 mg total) by mouth  3 (three) times daily for 10 days.    Dispense:  30 capsule    Refill:  0    Order Specific Question:   Supervising Provider    Answer:   Asencion Noble E [[id number]]   fluticasone (FLONASE) 50 MCG/ACT nasal spray    Sig: Place 2 sprays into both nostrils daily.    Dispense:  16 g    Refill:  6    Order Specific Question:   Supervising Provider    Answer:   Elsie Stain [[id number]]    Follow-up: Return if symptoms worsen or fail to improve.    Loraine Grip Mayers, PA-C

## 2021-09-09 ENCOUNTER — Other Ambulatory Visit (HOSPITAL_COMMUNITY): Payer: Self-pay

## 2021-09-15 ENCOUNTER — Other Ambulatory Visit (HOSPITAL_COMMUNITY): Payer: Self-pay

## 2021-09-19 DIAGNOSIS — Z227 Latent tuberculosis: Secondary | ICD-10-CM | POA: Diagnosis not present

## 2021-09-19 DIAGNOSIS — Z79899 Other long term (current) drug therapy: Secondary | ICD-10-CM | POA: Diagnosis not present

## 2021-09-19 DIAGNOSIS — M459 Ankylosing spondylitis of unspecified sites in spine: Secondary | ICD-10-CM | POA: Diagnosis not present

## 2021-09-19 DIAGNOSIS — R059 Cough, unspecified: Secondary | ICD-10-CM | POA: Diagnosis not present

## 2021-09-19 DIAGNOSIS — M4690 Unspecified inflammatory spondylopathy, site unspecified: Secondary | ICD-10-CM | POA: Diagnosis not present

## 2021-09-19 DIAGNOSIS — M797 Fibromyalgia: Secondary | ICD-10-CM | POA: Diagnosis not present

## 2021-09-19 DIAGNOSIS — R700 Elevated erythrocyte sedimentation rate: Secondary | ICD-10-CM | POA: Diagnosis not present

## 2021-09-19 DIAGNOSIS — M2550 Pain in unspecified joint: Secondary | ICD-10-CM | POA: Diagnosis not present

## 2021-10-04 ENCOUNTER — Other Ambulatory Visit (HOSPITAL_COMMUNITY): Payer: Self-pay

## 2021-10-06 ENCOUNTER — Other Ambulatory Visit (HOSPITAL_COMMUNITY): Payer: Self-pay

## 2021-10-06 ENCOUNTER — Other Ambulatory Visit: Payer: Self-pay | Admitting: Pharmacist

## 2021-10-06 MED ORDER — ENBREL SURECLICK 50 MG/ML ~~LOC~~ SOAJ: SUBCUTANEOUS | 3 refills | Status: DC

## 2021-10-06 MED ORDER — ENBREL SURECLICK 50 MG/ML ~~LOC~~ SOAJ
SUBCUTANEOUS | 3 refills | Status: DC
  Filled 2021-10-06: qty 4, 28d supply, fill #0
  Filled 2021-11-08: qty 4, 28d supply, fill #1
  Filled 2021-12-05: qty 4, 28d supply, fill #2
  Filled 2021-12-30: qty 4, 28d supply, fill #3

## 2021-10-09 ENCOUNTER — Other Ambulatory Visit (HOSPITAL_COMMUNITY): Payer: Self-pay

## 2021-10-10 ENCOUNTER — Other Ambulatory Visit (HOSPITAL_COMMUNITY): Payer: Self-pay

## 2021-10-10 MED ORDER — PANTOPRAZOLE SODIUM 40 MG PO TBEC
40.0000 mg | DELAYED_RELEASE_TABLET | Freq: Every day | ORAL | 0 refills | Status: DC
  Filled 2021-10-10: qty 90, 90d supply, fill #0

## 2021-10-17 ENCOUNTER — Other Ambulatory Visit (HOSPITAL_COMMUNITY): Payer: Self-pay

## 2021-10-18 ENCOUNTER — Other Ambulatory Visit (HOSPITAL_COMMUNITY): Payer: Self-pay

## 2021-10-21 DIAGNOSIS — Z1239 Encounter for other screening for malignant neoplasm of breast: Secondary | ICD-10-CM | POA: Diagnosis not present

## 2021-10-21 DIAGNOSIS — Z6829 Body mass index (BMI) 29.0-29.9, adult: Secondary | ICD-10-CM | POA: Diagnosis not present

## 2021-10-21 DIAGNOSIS — Z1231 Encounter for screening mammogram for malignant neoplasm of breast: Secondary | ICD-10-CM | POA: Diagnosis not present

## 2021-10-21 DIAGNOSIS — Z124 Encounter for screening for malignant neoplasm of cervix: Secondary | ICD-10-CM | POA: Diagnosis not present

## 2021-10-21 DIAGNOSIS — Z01419 Encounter for gynecological examination (general) (routine) without abnormal findings: Secondary | ICD-10-CM | POA: Diagnosis not present

## 2021-10-24 DIAGNOSIS — R87610 Atypical squamous cells of undetermined significance on cytologic smear of cervix (ASC-US): Secondary | ICD-10-CM | POA: Diagnosis not present

## 2021-10-24 DIAGNOSIS — Z124 Encounter for screening for malignant neoplasm of cervix: Secondary | ICD-10-CM | POA: Diagnosis not present

## 2021-10-26 ENCOUNTER — Other Ambulatory Visit (HOSPITAL_COMMUNITY): Payer: Self-pay

## 2021-11-08 ENCOUNTER — Other Ambulatory Visit (HOSPITAL_COMMUNITY): Payer: Self-pay

## 2021-11-09 ENCOUNTER — Other Ambulatory Visit (HOSPITAL_COMMUNITY): Payer: Self-pay

## 2021-11-23 ENCOUNTER — Other Ambulatory Visit (HOSPITAL_COMMUNITY): Payer: Self-pay

## 2021-11-29 ENCOUNTER — Other Ambulatory Visit (HOSPITAL_COMMUNITY): Payer: Self-pay

## 2021-12-05 ENCOUNTER — Other Ambulatory Visit (HOSPITAL_COMMUNITY): Payer: Self-pay

## 2021-12-08 ENCOUNTER — Other Ambulatory Visit (HOSPITAL_COMMUNITY): Payer: Self-pay

## 2021-12-30 ENCOUNTER — Other Ambulatory Visit (HOSPITAL_COMMUNITY): Payer: Self-pay

## 2022-01-02 DIAGNOSIS — Z79899 Other long term (current) drug therapy: Secondary | ICD-10-CM | POA: Diagnosis not present

## 2022-01-02 DIAGNOSIS — M4690 Unspecified inflammatory spondylopathy, site unspecified: Secondary | ICD-10-CM | POA: Diagnosis not present

## 2022-01-02 DIAGNOSIS — R059 Cough, unspecified: Secondary | ICD-10-CM | POA: Diagnosis not present

## 2022-01-02 DIAGNOSIS — Z227 Latent tuberculosis: Secondary | ICD-10-CM | POA: Diagnosis not present

## 2022-01-02 DIAGNOSIS — R700 Elevated erythrocyte sedimentation rate: Secondary | ICD-10-CM | POA: Diagnosis not present

## 2022-01-02 DIAGNOSIS — M2550 Pain in unspecified joint: Secondary | ICD-10-CM | POA: Diagnosis not present

## 2022-01-02 DIAGNOSIS — M459 Ankylosing spondylitis of unspecified sites in spine: Secondary | ICD-10-CM | POA: Diagnosis not present

## 2022-01-02 DIAGNOSIS — M797 Fibromyalgia: Secondary | ICD-10-CM | POA: Diagnosis not present

## 2022-01-04 ENCOUNTER — Other Ambulatory Visit (HOSPITAL_COMMUNITY): Payer: Self-pay

## 2022-01-29 ENCOUNTER — Other Ambulatory Visit (HOSPITAL_COMMUNITY): Payer: Self-pay

## 2022-01-30 ENCOUNTER — Other Ambulatory Visit (HOSPITAL_COMMUNITY): Payer: Self-pay

## 2022-01-30 ENCOUNTER — Other Ambulatory Visit: Payer: Self-pay | Admitting: Pharmacist

## 2022-01-30 MED ORDER — ENBREL SURECLICK 50 MG/ML ~~LOC~~ SOAJ
50.0000 mg | SUBCUTANEOUS | 3 refills | Status: DC
  Filled 2022-01-30: qty 4, 28d supply, fill #0
  Filled 2022-03-14: qty 4, 28d supply, fill #1
  Filled 2022-04-10: qty 4, 28d supply, fill #2
  Filled 2022-05-08: qty 4, 28d supply, fill #3

## 2022-01-30 MED ORDER — PANTOPRAZOLE SODIUM 40 MG PO TBEC
40.0000 mg | DELAYED_RELEASE_TABLET | Freq: Every day | ORAL | 0 refills | Status: DC
  Filled 2022-01-30: qty 90, 90d supply, fill #0

## 2022-01-30 MED ORDER — ENBREL SURECLICK 50 MG/ML ~~LOC~~ SOAJ: 50.0000 mg | SUBCUTANEOUS | 3 refills | Status: DC

## 2022-01-31 ENCOUNTER — Other Ambulatory Visit (HOSPITAL_COMMUNITY): Payer: Self-pay

## 2022-02-02 ENCOUNTER — Other Ambulatory Visit (HOSPITAL_COMMUNITY): Payer: Self-pay

## 2022-02-03 ENCOUNTER — Other Ambulatory Visit: Payer: Self-pay

## 2022-02-03 ENCOUNTER — Emergency Department (HOSPITAL_BASED_OUTPATIENT_CLINIC_OR_DEPARTMENT_OTHER)
Admission: EM | Admit: 2022-02-03 | Discharge: 2022-02-03 | Disposition: A | Discharge status: Home / Self Care | Payer: 59 | Attending: Emergency Medicine | Admitting: Emergency Medicine

## 2022-02-03 ENCOUNTER — Emergency Department (HOSPITAL_BASED_OUTPATIENT_CLINIC_OR_DEPARTMENT_OTHER): Payer: 59 | Admitting: Radiology

## 2022-02-03 ENCOUNTER — Encounter (HOSPITAL_BASED_OUTPATIENT_CLINIC_OR_DEPARTMENT_OTHER): Payer: Self-pay | Admitting: Obstetrics and Gynecology

## 2022-02-03 DIAGNOSIS — R072 Precordial pain: Secondary | ICD-10-CM | POA: Diagnosis present

## 2022-02-03 DIAGNOSIS — J45909 Unspecified asthma, uncomplicated: Secondary | ICD-10-CM | POA: Insufficient documentation

## 2022-02-03 DIAGNOSIS — Z7951 Long term (current) use of inhaled steroids: Secondary | ICD-10-CM | POA: Diagnosis not present

## 2022-02-03 DIAGNOSIS — R079 Chest pain, unspecified: Secondary | ICD-10-CM | POA: Diagnosis not present

## 2022-02-03 DIAGNOSIS — R0789 Other chest pain: Secondary | ICD-10-CM | POA: Diagnosis not present

## 2022-02-03 LAB — BASIC METABOLIC PANEL
Anion gap: 8 (ref 5–15)
BUN: 11 mg/dL (ref 6–20)
CO2: 24 mmol/L (ref 22–32)
Calcium: 9.3 mg/dL (ref 8.9–10.3)
Chloride: 105 mmol/L (ref 98–111)
Creatinine, Ser: 0.93 mg/dL (ref 0.44–1.00)
GFR, Estimated: >60 mL/min (ref >60)
Glucose, Bld: 86 mg/dL (ref 70–99)
Potassium: 3.9 mmol/L (ref 3.5–5.1)
Sodium: 137 mmol/L (ref 135–145)

## 2022-02-03 LAB — PREGNANCY, URINE: Preg Test, Ur: NEGATIVE

## 2022-02-03 LAB — CBC
HCT: 35.8 % — ABNORMAL LOW (ref 36.0–46.0)
Hemoglobin: 12.1 g/dL (ref 12.0–15.0)
MCH: 32.7 pg (ref 26.0–34.0)
MCHC: 33.8 g/dL (ref 30.0–36.0)
MCV: 96.8 fL (ref 80.0–100.0)
Platelets: 214 10*3/uL (ref 150–400)
RBC: 3.7 MIL/uL — ABNORMAL LOW (ref 3.87–5.11)
RDW: 11.9 % (ref 11.5–15.5)
WBC: 5.7 10*3/uL (ref 4.0–10.5)
nRBC: 0 % (ref 0.0–0.2)

## 2022-02-03 LAB — TROPONIN I (HIGH SENSITIVITY)
Troponin I (High Sensitivity): <2 ng/L (ref <18)
Troponin I (High Sensitivity): 2 ng/L (ref <18)

## 2022-02-03 MED ORDER — ALUM & MAG HYDROXIDE-SIMETH 200-200-20 MG/5ML PO SUSP
30.0000 mL | Freq: Once | ORAL | Status: AC
Start: 2022-02-03 — End: 2022-02-03
  Administered 2022-02-03: 30 mL via ORAL
  Filled 2022-02-03: qty 30

## 2022-02-03 NOTE — ED Triage Notes (Signed)
Patient reports that at 0300 this morning she was awaken with chest pain that shot through to her back and felt like it was radiating all over her chest. Patient denies nausea or emesis. Patient reports she took 1 nitro and it seemed to help some. Patient states she has a hx of cardiac pain and was seen back in 2013 and had an echo and full cardiac workup.

## 2022-02-03 NOTE — ED Notes (Signed)
ED Provider at bedside. 

## 2022-02-03 NOTE — ED Provider Notes (Signed)
[facility] Provider Note   CSN: [id number] Arrival date & time: 02/03/22  1200     History  Chief Complaint  Patient presents with   Chest Pain    Jamie Melton is a 51 y.o. female.   Chest Pain  Patient is a 51 year old female with a past medical history significant for ankylosing spondylitis, asthma, headaches, multiple joint pain  She presented to the emergency room today with complaints of chest pain.  She describes it as dull achy midsternal chest pain.  She denies any associated nausea vomiting sweating or shortness of breath.  She states that her symptoms came on around 3 AM this morning.  She states that the pain has been constant since.      Home Medications Prior to Admission medications   Medication Sig Start Date End Date Taking? Authorizing Provider  albuterol (VENTOLIN HFA) 108 (90 Base) MCG/ACT inhaler Inhale 2 puffs into the lungs every 6 (six) hours as needed. 07/08/21     etanercept (ENBREL SURECLICK) 50 MG/ML injection Inject 50 mg into the skin every 7 (seven) days. 01/30/22   Tresa Garter, MD  fluticasone (FLONASE) 50 MCG/ACT nasal spray Place 2 sprays into both nostrils daily. 08/30/21   Mayers, Cari S, PA-C  Multiple Vitamin (MULTIVITAMIN WITH MINERALS) TABS tablet Take 1 tablet by mouth daily.    [provider]  pantoprazole (PROTONIX) 40 MG tablet Take 1 tablet (40 mg total) by mouth daily. 01/30/22     polyethylene glycol-electrolytes (NULYTELY) 420 g solution Use as directed 12/28/20         Allergies    Patient has no known allergies.    Review of Systems   Review of Systems  Cardiovascular:  Positive for chest pain.    Physical Exam Updated Vital Signs BP 126/78   Pulse 66   Temp 98.3 F (36.8 C) (Oral)   Resp 15   Ht 5' 1.5" (1.562 m)   Wt 71.2 kg   LMP 01/25/2022 (Exact Date)   SpO2 100%   BMI 29.18 kg/m  Physical Exam Vitals and nursing note reviewed.  Constitutional:       General: She is not in acute distress. HENT:     Head: Normocephalic and atraumatic.     Nose: Nose normal.     Mouth/Throat:     Mouth: Mucous membranes are moist.  Eyes:     General: No scleral icterus. Cardiovascular:     Rate and Rhythm: Normal rate and regular rhythm.     Pulses: Normal pulses.     Heart sounds: Normal heart sounds.  Pulmonary:     Effort: Pulmonary effort is normal. No respiratory distress.     Breath sounds: No wheezing.  Abdominal:     Palpations: Abdomen is soft.     Tenderness: There is no abdominal tenderness. There is no guarding or rebound.  Musculoskeletal:     Cervical back: Normal range of motion.     Right lower leg: No edema.     Left lower leg: No edema.  Skin:    General: Skin is warm and dry.     Capillary Refill: Capillary refill takes less than 2 seconds.  Neurological:     Mental Status: She is alert. Mental status is at baseline.  Psychiatric:        Mood and Affect: Mood normal.        Behavior: Behavior normal.     ED Results / Procedures / Treatments  Labs (all labs ordered are listed, but only abnormal results are displayed) Labs Reviewed  CBC - Abnormal; Notable for the following components:      Result Value   RBC 3.70 (*)    HCT 35.8 (*)    All other components within normal limits  BASIC METABOLIC PANEL  PREGNANCY, URINE  TROPONIN I (HIGH SENSITIVITY)  TROPONIN I (HIGH SENSITIVITY)    EKG EKG Interpretation  Date/Time:  Friday February 03 2022 12:06:50 EDT Ventricular Rate:  65 PR Interval:  187 QRS Duration: 108 QT Interval:  380 QTC Calculation: 396 R Axis:   -59 Text Interpretation: Sinus rhythm LAD, consider left anterior fascicular block Abnormal R-wave progression, late transition Confirmed by Lacretia Leigh ([id number]) on 02/03/2022 1:47:02 PM  Radiology DG Chest 2 View  Result Date: 02/03/2022 CLINICAL DATA:  chest pain EXAM: CHEST - 2 VIEW COMPARISON:  April 09, 2019 FINDINGS: The heart size and  mediastinal contours are within normal limits. Both lungs are clear and stable. The visualized skeletal structures are unremarkable. IMPRESSION: No active cardiopulmonary disease. Electronically Signed   By: Frazier Richards M.D.   On: 02/03/2022 12:27    Procedures Procedures    Medications Ordered in ED Medications  alum & mag hydroxide-simeth (MAALOX/MYLANTA) 200-200-20 MG/5ML suspension 30 mL (30 mLs Oral Given 02/03/22 1241)    ED Course/ Medical Decision Making/ A&P Clinical Course as of 02/03/22 1407  Fri Feb 03, 2022  1222 Dull achy mid sternal pain. No nv, diaphoresis.  No smoker.  [WF]  1345 Reevaluated patient.  She denies any pain currently.  No other associate symptoms.  Waiting on second troponin. [WF]    Clinical Course User Index [WF] Tedd Sias, PA                           Medical Decision Making Amount and/or Complexity of Data Reviewed Labs: ordered. Radiology: ordered.  Risk OTC drugs.   This patient presents to the ED for concern of chest pain, this involves a number of treatment options, and is a complaint that carries with it a moderate-to-high risk of complications and morbidity.  The differential diagnosis includes The emergent causes of chest pain include: Acute coronary syndrome, tamponade, pericarditis/myocarditis, aortic dissection, pulmonary embolism, tension pneumothorax, pneumonia, and esophageal rupture.    I do not--in this particular case--believe the patient has an emergent cause of chest pain, other urgent/non-acute considerations include, but are not limited to: chronic angina, aortic stenosis, cardiomyopathy, mitral valve prolapse, pulmonary hypertension, aortic insufficiency, right ventricular hypertrophy, pleuritis, bronchitis, pneumothorax, tumor, gastroesophageal reflux disease (GERD), esophageal spasm, Mallory-Weiss syndrome, peptic ulcer disease, pancreatitis, functional gastrointestinal pain, cervical or thoracic disk disease or  arthritis, shoulder arthritis, costochondritis, subacromial bursitis, anxiety or panic attack, herpes zoster, breast disorders, chest wall tumors, thoracic outlet syndrome, mediastinitis.    Co morbidities: Discussed in HPI   Brief History:  Patient is a 51 year old female with a past medical history significant for ankylosing spondylitis  She presented to the emergency room today with complaints of chest pain.  She describes it as dull achy midsternal chest pain.  She denies any associated nausea vomiting sweating or shortness of breath.  She states that her symptoms came on around 3 AM this morning.  She states that the pain has been constant since.      EMR reviewed including pt PMHx, past surgical history and past visits to ER.   See HPI for more details  Lab Tests:   I ordered and independently interpreted labs. Labs notable for normal BMP, CBC unremarkable, troponin x1 within normal months and urine pregnancy negative.  Second troponin is pending   Second troponin is 2   Imaging Studies:  NAD. I personally reviewed all imaging studies and no acute abnormality found. I agree with radiology interpretation. Chest x-ray unremarkable   Cardiac Monitoring:      Medicines ordered:  I ordered medication including GI cocktail for chest discomfort Reevaluation of the patient after these medicines showed that the patient stayed the same I have reviewed the patients home medicines and have made adjustments as needed   Critical Interventions:     Consults/Attending Physician      Reevaluation:  After the interventions noted above I re-evaluated patient and found that they have :improved   Social Determinants of Health:      Problem List / ED Course:  Patient with atypical sounding chest pain.  She is 51 years old.  Patient has a heart score of 2.  She has minimal symptoms of very atypical sounding pain.  Will discharge home with close follow-up  with cardiology.  Patient is agreeable to plan.  Will discharge home at this time.  She will follow back up with cardiology as she is already established with [facility].   Dispostion:  After consideration of the diagnostic results and the patients response to treatment, I feel that the patent would benefit from close follow-up with outpatient cardiology and PCP  Final Clinical Impression(s) / ED Diagnoses Final diagnoses:  Atypical chest pain    Rx / DC Orders ED Discharge Orders     None         Tedd Sias, [place] 02/03/22 1604    Lacretia Leigh, MD 02/06/22 1438

## 2022-02-03 NOTE — ED Notes (Signed)
Discharge instructions and follow up care reviewed and explained, pt verbalized understanding and had no further questions at d/c. Pt caox4 and ambulatory on departure.

## 2022-02-03 NOTE — Discharge Instructions (Signed)
Please take Tylenol 1000 mg every 6 hours for chest pain.  Drink plenty of water and follow-up with your primary care provider.  I have also given you the information for a cardiologist to follow-up with.  Please do so.  Please return the emergency room for any new or concerning symptoms such as new chest pain, difficulty breathing, fever, any other new or concerning symptoms

## 2022-02-06 ENCOUNTER — Other Ambulatory Visit (HOSPITAL_COMMUNITY): Payer: Self-pay

## 2022-02-08 ENCOUNTER — Ambulatory Visit: Payer: 59 | Admitting: Family

## 2022-02-08 ENCOUNTER — Encounter: Payer: Self-pay | Admitting: Family

## 2022-02-08 VITALS — BP 127/72 | HR 69 | Temp 98.5°F | Ht 61.5 in | Wt 158.1 lb

## 2022-02-08 DIAGNOSIS — M459 Ankylosing spondylitis of unspecified sites in spine: Secondary | ICD-10-CM | POA: Insufficient documentation

## 2022-02-08 DIAGNOSIS — J4540 Moderate persistent asthma, uncomplicated: Secondary | ICD-10-CM | POA: Insufficient documentation

## 2022-02-08 NOTE — Assessment & Plan Note (Signed)
   chronic  albuterol rescue inhaler, using almost daily recently  having more SOB, coughing, nighttime awakenings  starting symbicort low dose, advised on use & SE  f/u in 35mo or prn

## 2022-02-08 NOTE — Patient Instructions (Signed)
Welcome to [facility], It was a pleasure meeting you today!    As discussed, I have sent Symbicort to your pharmacy for your Asthma. Start with 1 puff twice a day and increase to 2 puffs if not controlling your symptoms. Continue to use your rescue inhaler as needed, but hopefully will not need it ongoing.  Please schedule a physical with fasting labs today.  Have a great day!    PLEASE NOTE: If you had any LAB tests please let us know if you have not heard back within a few days. You may see your results on MyChart before we have a chance to review them but we will give you a call once they are reviewed by [place]. If we ordered any REFERRALS today, please let us know if you have not heard from their office within the next week.  Let us know through MyChart if you are needing REFILLS, or have your pharmacy send [place] the request. You can also use MyChart to communicate with me or any office staff.   Please try these tips to maintain a healthy lifestyle:  Eat most of your calories during the day when you are active. Eliminate processed foods including packaged sweets (pies, cakes, cookies), reduce intake of potatoes, white bread, white pasta, and white rice. Look for whole grain options, oat flour or almond flour.  Each meal should contain half fruits/vegetables, one quarter protein, and one quarter carbs (no bigger than a computer mouse).  Cut down on sweet beverages. This includes juice, soda, and sweet tea. Also watch fruit intake, though this is a healthier sweet option, it still contains natural sugar! Limit to 3 servings daily.  Drink at least 1 8oz. glass of water with each meal and aim for at least 8 glasses per day  Exercise at least 150 minutes every week.
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
## 2022-02-10 ENCOUNTER — Other Ambulatory Visit (HOSPITAL_COMMUNITY): Payer: Self-pay

## 2022-02-15 ENCOUNTER — Ambulatory Visit (INDEPENDENT_AMBULATORY_CARE_PROVIDER_SITE_OTHER): Payer: 59 | Admitting: Family

## 2022-02-15 ENCOUNTER — Other Ambulatory Visit (HOSPITAL_COMMUNITY): Payer: Self-pay

## 2022-02-15 ENCOUNTER — Encounter: Payer: Self-pay | Admitting: Family

## 2022-02-15 VITALS — BP 102/65 | HR 73 | Temp 98.3°F | Ht 61.5 in | Wt 157.6 lb

## 2022-02-15 DIAGNOSIS — Z0000 Encounter for general adult medical examination without abnormal findings: Secondary | ICD-10-CM

## 2022-02-15 DIAGNOSIS — J4540 Moderate persistent asthma, uncomplicated: Secondary | ICD-10-CM | POA: Diagnosis not present

## 2022-02-15 DIAGNOSIS — E041 Nontoxic single thyroid nodule: Secondary | ICD-10-CM | POA: Diagnosis not present

## 2022-02-15 DIAGNOSIS — Z227 Latent tuberculosis: Secondary | ICD-10-CM | POA: Insufficient documentation

## 2022-02-15 DIAGNOSIS — IMO0002 Reserved for concepts with insufficient information to code with codable children: Secondary | ICD-10-CM | POA: Insufficient documentation

## 2022-02-15 DIAGNOSIS — M797 Fibromyalgia: Secondary | ICD-10-CM

## 2022-02-15 DIAGNOSIS — M2550 Pain in unspecified joint: Secondary | ICD-10-CM | POA: Insufficient documentation

## 2022-02-15 DIAGNOSIS — R81 Glycosuria: Secondary | ICD-10-CM | POA: Insufficient documentation

## 2022-02-15 DIAGNOSIS — M1990 Unspecified osteoarthritis, unspecified site: Secondary | ICD-10-CM | POA: Insufficient documentation

## 2022-02-15 HISTORY — DX: Fibromyalgia: M79.7

## 2022-02-15 MED ORDER — BUDESONIDE-FORMOTEROL FUMARATE 80-4.5 MCG/ACT IN AERO
1.0000 | INHALATION_SPRAY | Freq: Two times a day (BID) | RESPIRATORY_TRACT | 3 refills | Status: DC
  Filled 2022-02-15: qty 10.2, 30d supply, fill #0
  Filled 2022-07-20: qty 10.2, 30d supply, fill #1
  Filled 2022-11-09: qty 10.2, 30d supply, fill #2
  Filled 2023-01-26: qty 10.2, 30d supply, fill #3
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
## 2022-02-16 LAB — CBC WITH DIFFERENTIAL/PLATELET
Basophils Absolute: 0.1 10*3/uL (ref 0.0–0.1)
Basophils Relative: 1 % (ref 0.0–3.0)
Eosinophils Absolute: 0.7 10*3/uL (ref 0.0–0.7)
Eosinophils Relative: 9.3 % — ABNORMAL HIGH (ref 0.0–5.0)
HCT: 37.8 % (ref 36.0–46.0)
Hemoglobin: 12.5 g/dL (ref 12.0–15.0)
Lymphocytes Relative: 34.3 % (ref 12.0–46.0)
Lymphs Abs: 2.6 10*3/uL (ref 0.7–4.0)
MCHC: 33.2 g/dL (ref 30.0–36.0)
MCV: 97.9 fl (ref 78.0–100.0)
Monocytes Absolute: 0.6 10*3/uL (ref 0.1–1.0)
Monocytes Relative: 7.7 % (ref 3.0–12.0)
Neutro Abs: 3.6 10*3/uL (ref 1.4–7.7)
Neutrophils Relative %: 47.7 % (ref 43.0–77.0)
Platelets: 197 10*3/uL (ref 150.0–400.0)
RBC: 3.86 Mil/uL — ABNORMAL LOW (ref 3.87–5.11)
RDW: 12.6 % (ref 11.5–15.5)
WBC: 7.6 10*3/uL (ref 4.0–10.5)

## 2022-02-16 LAB — LIPID PANEL
Cholesterol: 165 mg/dL (ref 0–200)
HDL: 63 mg/dL (ref >39.00)
LDL Cholesterol: 94 mg/dL (ref 0–99)
NonHDL: 101.55
Total CHOL/HDL Ratio: 3
Triglycerides: 37 mg/dL (ref 0.0–149.0)
VLDL: 7.4 mg/dL (ref 0.0–40.0)

## 2022-02-16 LAB — COMPREHENSIVE METABOLIC PANEL
ALT: 14 U/L (ref 0–35)
AST: 17 U/L (ref 0–37)
Albumin: 4.6 g/dL (ref 3.5–5.2)
Alkaline Phosphatase: 53 U/L (ref 39–117)
BUN: 13 mg/dL (ref 6–23)
CO2: 25 mEq/L (ref 19–32)
Calcium: 10 mg/dL (ref 8.4–10.5)
Chloride: 106 mEq/L (ref 96–112)
Creatinine, Ser: 0.93 mg/dL (ref 0.40–1.20)
GFR: 71.97 mL/min (ref >60.00)
Glucose, Bld: 76 mg/dL (ref 70–99)
Potassium: 4.9 mEq/L (ref 3.5–5.1)
Sodium: 140 mEq/L (ref 135–145)
Total Bilirubin: 0.5 mg/dL (ref 0.2–1.2)
Total Protein: 7.8 g/dL (ref 6.0–8.3)

## 2022-02-16 LAB — T4, FREE: Free T4: 0.77 ng/dL (ref 0.60–1.60)

## 2022-02-16 LAB — TSH: TSH: 1.16 u[IU]/mL (ref 0.35–5.50)

## 2022-02-17 NOTE — Progress Notes (Signed)
Hi Jamie Melton,  All of your labs are within normal range. Your Eosinophils are high on your blood count, but this indicates your body is reacting to allergens which is reflected in your worsening asthma symptoms. Glucose (sugar), electrolytes, kidney and liver function, blood count, thyroid, and cholesterol numbers are all good.   Keep up the good work with controlling your diet and continue to try and shoot for 30 minutes of exercise daily!

## 2022-02-19 NOTE — Assessment & Plan Note (Signed)
   chronic  restarted SABA a week ago, but reports not controlling her sx  starting Symbicort, advised on use & SE  f/u in 3-24mo or prn

## 2022-02-23 ENCOUNTER — Other Ambulatory Visit (HOSPITAL_COMMUNITY): Payer: Self-pay

## 2022-02-27 ENCOUNTER — Other Ambulatory Visit (HOSPITAL_COMMUNITY): Payer: Self-pay

## 2022-03-09 ENCOUNTER — Other Ambulatory Visit (HOSPITAL_COMMUNITY): Payer: Self-pay

## 2022-03-10 ENCOUNTER — Other Ambulatory Visit (HOSPITAL_COMMUNITY): Payer: Self-pay

## 2022-03-14 ENCOUNTER — Other Ambulatory Visit (HOSPITAL_COMMUNITY): Payer: Self-pay

## 2022-03-16 ENCOUNTER — Other Ambulatory Visit (HOSPITAL_COMMUNITY): Payer: Self-pay

## 2022-03-20 DIAGNOSIS — H524 Presbyopia: Secondary | ICD-10-CM | POA: Diagnosis not present

## 2022-03-20 DIAGNOSIS — H5213 Myopia, bilateral: Secondary | ICD-10-CM | POA: Diagnosis not present

## 2022-03-20 DIAGNOSIS — H40013 Open angle with borderline findings, low risk, bilateral: Secondary | ICD-10-CM | POA: Diagnosis not present

## 2022-04-06 ENCOUNTER — Other Ambulatory Visit (HOSPITAL_COMMUNITY): Payer: Self-pay

## 2022-04-10 ENCOUNTER — Other Ambulatory Visit (HOSPITAL_COMMUNITY): Payer: Self-pay

## 2022-04-13 ENCOUNTER — Other Ambulatory Visit (HOSPITAL_COMMUNITY): Payer: Self-pay

## 2022-04-17 ENCOUNTER — Other Ambulatory Visit (HOSPITAL_COMMUNITY): Payer: Self-pay

## 2022-04-17 ENCOUNTER — Encounter: Payer: Self-pay | Admitting: *Deleted

## 2022-04-17 MED ORDER — ALBUTEROL SULFATE HFA 108 (90 BASE) MCG/ACT IN AERS
2.0000 | INHALATION_SPRAY | Freq: Four times a day (QID) | RESPIRATORY_TRACT | 4 refills | Status: DC | PRN
  Filled 2022-04-17: qty 6.7, 25d supply, fill #0
  Filled 2022-06-05: qty 6.7, 25d supply, fill #1

## 2022-04-18 ENCOUNTER — Other Ambulatory Visit (HOSPITAL_COMMUNITY): Payer: Self-pay

## 2022-04-19 ENCOUNTER — Other Ambulatory Visit (HOSPITAL_COMMUNITY): Payer: Self-pay

## 2022-05-04 ENCOUNTER — Other Ambulatory Visit (HOSPITAL_COMMUNITY): Payer: Self-pay

## 2022-05-08 ENCOUNTER — Other Ambulatory Visit (HOSPITAL_COMMUNITY): Payer: Self-pay

## 2022-05-10 ENCOUNTER — Other Ambulatory Visit (HOSPITAL_COMMUNITY): Payer: Self-pay

## 2022-05-30 ENCOUNTER — Other Ambulatory Visit (HOSPITAL_COMMUNITY): Payer: Self-pay

## 2022-06-01 ENCOUNTER — Other Ambulatory Visit (HOSPITAL_COMMUNITY): Payer: Self-pay

## 2022-06-02 ENCOUNTER — Other Ambulatory Visit (HOSPITAL_COMMUNITY): Payer: Self-pay

## 2022-06-05 ENCOUNTER — Other Ambulatory Visit (HOSPITAL_COMMUNITY): Payer: Self-pay

## 2022-06-06 ENCOUNTER — Other Ambulatory Visit (HOSPITAL_COMMUNITY): Payer: Self-pay

## 2022-06-07 ENCOUNTER — Other Ambulatory Visit (HOSPITAL_COMMUNITY): Payer: Self-pay

## 2022-06-08 ENCOUNTER — Telehealth: Payer: Self-pay | Admitting: Pharmacist

## 2022-06-08 NOTE — Telephone Encounter (Signed)
Called patient to schedule an appointment for the [facility] [facility]. I was unable to reach the patient so I left a HIPAA-compliant message requesting that the patient return my call.   Luke Van Ausdall, PharmD, BCACP, CPP Clinical Pharmacist [facility] [phone]  

## 2022-06-13 ENCOUNTER — Other Ambulatory Visit (HOSPITAL_COMMUNITY): Payer: Self-pay

## 2022-06-13 MED ORDER — ENBREL SURECLICK 50 MG/ML ~~LOC~~ SOAJ
50.0000 mg | SUBCUTANEOUS | 0 refills | Status: DC
  Filled 2022-06-13: qty 4, 28d supply, fill #0

## 2022-06-14 ENCOUNTER — Other Ambulatory Visit (HOSPITAL_COMMUNITY): Payer: Self-pay

## 2022-06-14 ENCOUNTER — Ambulatory Visit: Payer: 59 | Attending: Family | Admitting: Pharmacist

## 2022-06-14 DIAGNOSIS — Z79899 Other long term (current) drug therapy: Secondary | ICD-10-CM

## 2022-06-14 MED ORDER — ENBREL SURECLICK 50 MG/ML ~~LOC~~ SOAJ
50.0000 mg | SUBCUTANEOUS | 0 refills | Status: DC
  Filled 2022-06-14: qty 4, 28d supply, fill #0

## 2022-06-14 NOTE — Progress Notes (Signed)
   S: Patient presents for review of their specialty medication therapy.  Patient is currently taking Enbrel for ankylosing spondylitis. Patient is managed by Dr. Kathlene November for this.   Adherence: confirms  Efficacy: has not yet started   Dosing: '50mg'$  subq once weekly   Ankylosing spondylitis, psoriatic arthritis, rheumatoid arthritis: SubQ: Note: May continue methotrexate, glucocorticoids, salicylates, NSAIDs, or analgesics during etanercept therapy. Once-weekly dosing: 50 mg once weekly; maximum dose (rheumatoid arthritis): 50 mg/week. Twice-weekly dosing (off-label dose): 25 mg twice weekly (Bathon 2000; Calin 2004; Rosana Hoes 2003; Genovese 2002; Mease 2000; Mease 2002)  Screening: TB test: completed  Hepatitis B: completed   Monitoring: Injection site reactions: none S/sx of infections: none S/sx of malignancy: none GI upset: none  O: Lab Results  Component Value Date   WBC 7.6 02/15/2022   HGB 12.5 02/15/2022   HCT 37.8 02/15/2022   MCV 97.9 02/15/2022   PLT 197.0 02/15/2022      Chemistry      Component Value Date/Time   NA 140 02/15/2022 1605   K 4.9 02/15/2022 1605   CL 106 02/15/2022 1605   CO2 25 02/15/2022 1605   BUN 13 02/15/2022 1605   CREATININE 0.93 02/15/2022 1605   CREATININE 0.76 05/21/2019 1443      Component Value Date/Time   CALCIUM 10.0 02/15/2022 1605   ALKPHOS 53 02/15/2022 1605   AST 17 02/15/2022 1605   ALT 14 02/15/2022 1605   BILITOT 0.5 02/15/2022 1605       A/P: 1. Medication review: patient currently taking Enbrel for ankylosing spondylitis. Reviewed the medication with the patient, including the following: Enbrel (etanercept) binds tumor necrosis factor (TNF) and blocks its interaction with cell surface receptors. TNF plays an important role in the inflammatory processes of many diseases. Patient educated on purpose, proper use and potential adverse effects of Enbrel. SubQ: Administer subcutaneously. Rotate injection sites; may inject  into the thigh (preferred), abdomen (avoiding the 2-inch area around the navel), or outer areas of upper arm. New injections should be given at least 1 inch from an old site and never into areas where the skin is tender, bruised, red, or hard; any raised thick, red, or scaly skin patches or lesions; or areas with scars or stretch marks. For a more comfortable injection, allow autoinjectors, prefilled syringes, and dose trays to reach room temperature for 15 to 30 minutes (?30 minutes for autoinjector) prior to injection; do not remove the needle cover while allowing product to reach room temperature. There may be small white particles of protein in the solution; this is not unusual for proteinaceous solutions. Possible adverse effects include rash, GI upset, increased risk of infection, and injection site reactions. Patients should stop Enbrel if they develop a serious infection. There is a possible increased risk in lymphoma and other malignancies. No recommendations for any changes.   Benard Halsted, PharmD, [place], [place] [phone]

## 2022-06-19 ENCOUNTER — Other Ambulatory Visit (HOSPITAL_COMMUNITY): Payer: Self-pay

## 2022-06-19 MED ORDER — ENBREL SURECLICK 50 MG/ML ~~LOC~~ SOAJ
50.0000 mg | SUBCUTANEOUS | 0 refills | Status: DC
  Filled 2022-07-14 (×2): qty 4, 28d supply, fill #0

## 2022-06-20 ENCOUNTER — Other Ambulatory Visit (HOSPITAL_COMMUNITY): Payer: Self-pay

## 2022-06-20 ENCOUNTER — Ambulatory Visit (INDEPENDENT_AMBULATORY_CARE_PROVIDER_SITE_OTHER): Payer: 59 | Admitting: Podiatry

## 2022-06-20 ENCOUNTER — Encounter: Payer: Self-pay | Admitting: Podiatry

## 2022-06-20 DIAGNOSIS — Q828 Other specified congenital malformations of skin: Secondary | ICD-10-CM | POA: Diagnosis not present

## 2022-06-20 MED ORDER — AMMONIUM LACTATE 12 % EX LOTN
1.0000 | TOPICAL_LOTION | CUTANEOUS | 5 refills | Status: DC | PRN
  Filled 2022-06-20: qty 400, 30d supply, fill #0

## 2022-06-20 NOTE — Progress Notes (Signed)
Subjective:  Patient ID: Jamie Melton, female    DOB: 06-11-1971,  MRN: [id number] HPI Chief Complaint  Patient presents with   Foot Pain    Plantar foot bilateral - multiple, tiny callused lesions x couple year, getting more of them and getting more tender   New Patient (Initial Visit)    51 y.o. female presents with the above complaint.   ROS: Denies fever chills nausea vomit muscle aches pains calf pain back pain chest pain shortness of breath.  Past Medical History:  Diagnosis Date   Acute non-recurrent frontal sinusitis 08/30/2021   Asthma    DR KOSLOW   Asthma 08/02/2011   Chest pain 08/02/2011   Fibroid 2012   Fibromyalgia 02/15/2022   GERD (gastroesophageal reflux disease)    Headache(784.0)    FREQUENT   Infection    UTI   Pain in joint, multiple sites    Palpable abd. aorta 08/02/2011   PROM (premature rupture of membranes) at term 03/29/2013   Spondylosis    Thrombocytopenia, unspecified ([place]) 03/29/2013   Platelets 88 on admission. 140 at 28 weeks 218 at NOB.   Unspecified abortion, complicated by metabolic disorder, unspecified    Vaginal delivery 04/01/2013   Past Surgical History:  Procedure Laterality Date   APPENDECTOMY     BILATERAL SALPINGECTOMY Bilateral 03/30/2013   Procedure: BILATERAL SALPINGECTOMY;  Surgeon: Eldred Manges, MD;  Location: [facility];  Service: Obstetrics;  Laterality: Bilateral;   CESAREAN SECTION N/A 03/30/2013   Procedure: Primary CESAREAN SECTION of baby  boy at [place] APGAR 8/9;  Surgeon: Eldred Manges, MD;  Location: [facility];  Service: Obstetrics;  Laterality: N/A;   NASAL SINUS SURGERY      Current Outpatient Medications:    ammonium lactate (LAC-HYDRIN) 12 % lotion, Apply 1 Application topically as needed for dry skin., Disp: 400 g, Rfl: 5   albuterol (VENTOLIN HFA) 108 (90 Base) MCG/ACT inhaler, Inhale 2 puffs into the lungs every 6 (six) hours as needed., Disp: 6.7 g, Rfl: 4   budesonide-formoterol (SYMBICORT) 80-4.5 MCG/ACT  inhaler, Inhale 1 puff into the lungs 2 (two) times daily. If still symptomatic after 1 week, do 2 puffs 2 times daily, Disp: 10.2 g, Rfl: 3   Calcium Carb-Cholecalciferol (LIQUID CALCIUM WITH D3) 600-12.5 MG-MCG CAPS, , Disp: , Rfl:    etanercept (ENBREL SURECLICK) 50 MG/ML injection, Inject 50 mg into the skin sub-q,  once a week., Disp: 4 mL, Rfl: 0   fluticasone (FLONASE) 50 MCG/ACT nasal spray, Place 2 sprays into both nostrils daily., Disp: 16 g, Rfl: 6   Multiple Vitamin (MULTIVITAMIN WITH MINERALS) TABS tablet, Take 1 tablet by mouth daily., Disp: , Rfl:    polyethylene glycol-electrolytes (NULYTELY) 420 g solution, Use as directed, Disp: 4000 mL, Rfl: 0  No Known Allergies Review of Systems Objective:  There were no vitals filed for this visit.  General: Well developed, nourished, in no acute distress, alert and oriented x3   Dermatological: Skin is warm, dry and supple bilateral. Nails x 10 are well maintained; remaining integument appears unremarkable at this time. There are no open sores, no preulcerative lesions, no rash or signs of infection present.  Porokeratotic lesions to the plantar aspect of the forefoot bilateral she also has not in the palms of her hands.  These do not appear to be verrucoid in nature.  Vascular: Dorsalis Pedis artery and Posterior Tibial artery pedal pulses are 2/4 bilateral with immedate capillary fill time. Pedal hair growth present.  No varicosities and no lower extremity edema present bilateral.   Neruologic: Grossly intact via light touch bilateral. Vibratory intact via tuning fork bilateral. Protective threshold with Semmes Wienstein monofilament intact to all pedal sites bilateral. Patellar and Achilles deep tendon reflexes 2+ bilateral. No Babinski or clonus noted bilateral.   Musculoskeletal: No gross boney pedal deformities bilateral. No pain, crepitus, or limitation noted with foot and ankle range of motion bilateral. Muscular strength 5/5 in all  groups tested bilateral.  Gait: Unassisted, Nonantalgic.    Radiographs:  None taken  Assessment & Plan:   Assessment: Benign skin lesions forefoot bilateral concentrated beneath the fifth metatarsal and the lateral column of the foot.  Plan: Discussed etiology pathology conservative surgical therapies at this point I did prescribe AmLactin.  Discussed manual debridement with her.  She understands and is amenable to it we will follow-up with me on an as-needed basis.     Dorna Mallet T. [place], [place]

## 2022-06-21 ENCOUNTER — Other Ambulatory Visit (HOSPITAL_COMMUNITY): Payer: Self-pay

## 2022-06-26 ENCOUNTER — Other Ambulatory Visit (HOSPITAL_COMMUNITY): Payer: Self-pay

## 2022-07-03 ENCOUNTER — Other Ambulatory Visit (HOSPITAL_COMMUNITY): Payer: Self-pay

## 2022-07-05 ENCOUNTER — Other Ambulatory Visit: Payer: Self-pay

## 2022-07-05 DIAGNOSIS — M2550 Pain in unspecified joint: Secondary | ICD-10-CM | POA: Diagnosis not present

## 2022-07-05 DIAGNOSIS — M459 Ankylosing spondylitis of unspecified sites in spine: Secondary | ICD-10-CM | POA: Diagnosis not present

## 2022-07-05 DIAGNOSIS — R682 Dry mouth, unspecified: Secondary | ICD-10-CM | POA: Diagnosis not present

## 2022-07-05 DIAGNOSIS — Z227 Latent tuberculosis: Secondary | ICD-10-CM | POA: Diagnosis not present

## 2022-07-05 DIAGNOSIS — Z79899 Other long term (current) drug therapy: Secondary | ICD-10-CM | POA: Diagnosis not present

## 2022-07-05 DIAGNOSIS — M4690 Unspecified inflammatory spondylopathy, site unspecified: Secondary | ICD-10-CM | POA: Diagnosis not present

## 2022-07-05 DIAGNOSIS — M797 Fibromyalgia: Secondary | ICD-10-CM | POA: Diagnosis not present

## 2022-07-05 DIAGNOSIS — R700 Elevated erythrocyte sedimentation rate: Secondary | ICD-10-CM | POA: Diagnosis not present

## 2022-07-06 ENCOUNTER — Encounter: Payer: Self-pay | Admitting: *Deleted

## 2022-07-07 ENCOUNTER — Other Ambulatory Visit (HOSPITAL_COMMUNITY): Payer: Self-pay

## 2022-07-07 MED ORDER — CEFADROXIL 500 MG PO CAPS
500.0000 mg | ORAL_CAPSULE | Freq: Two times a day (BID) | ORAL | 0 refills | Status: DC
  Filled 2022-07-07: qty 14, 7d supply, fill #0

## 2022-07-07 MED ORDER — PREDNISONE 5 MG PO TABS
ORAL_TABLET | ORAL | 0 refills | Status: AC
  Filled 2022-07-07: qty 21, 6d supply, fill #0

## 2022-07-07 MED ORDER — OXYCODONE-ACETAMINOPHEN 5-325 MG PO TABS
1.0000 | ORAL_TABLET | ORAL | 0 refills | Status: DC | PRN
  Filled 2022-07-07: qty 30, 3d supply, fill #0

## 2022-07-10 HISTORY — PX: ABDOMINOPLASTY: SUR9

## 2022-07-14 ENCOUNTER — Other Ambulatory Visit: Payer: Self-pay

## 2022-07-14 ENCOUNTER — Other Ambulatory Visit (HOSPITAL_COMMUNITY): Payer: Self-pay

## 2022-07-20 ENCOUNTER — Other Ambulatory Visit (HOSPITAL_COMMUNITY): Payer: Self-pay

## 2022-07-20 MED ORDER — TRAMADOL HCL 50 MG PO TABS
50.0000 mg | ORAL_TABLET | ORAL | 0 refills | Status: DC
  Filled 2022-07-20: qty 40, 5d supply, fill #0

## 2022-07-31 ENCOUNTER — Other Ambulatory Visit: Payer: Self-pay

## 2022-07-31 ENCOUNTER — Other Ambulatory Visit (HOSPITAL_COMMUNITY): Payer: Self-pay

## 2022-08-02 ENCOUNTER — Other Ambulatory Visit (HOSPITAL_COMMUNITY): Payer: Self-pay

## 2022-08-02 MED ORDER — CHLORHEXIDINE GLUCONATE 0.12 % MT SOLN
Freq: Two times a day (BID) | OROMUCOSAL | 1 refills | Status: DC
  Filled 2022-08-02: qty 473, 15d supply, fill #0

## 2022-08-02 MED ORDER — AMOXICILLIN 250 MG PO CAPS
250.0000 mg | ORAL_CAPSULE | Freq: Four times a day (QID) | ORAL | 0 refills | Status: DC
  Filled 2022-08-02: qty 40, 10d supply, fill #0

## 2022-08-08 ENCOUNTER — Other Ambulatory Visit (HOSPITAL_COMMUNITY): Payer: Self-pay

## 2022-08-15 ENCOUNTER — Other Ambulatory Visit (HOSPITAL_COMMUNITY): Payer: Self-pay

## 2022-08-24 ENCOUNTER — Other Ambulatory Visit (HOSPITAL_COMMUNITY): Payer: Self-pay

## 2022-09-19 ENCOUNTER — Other Ambulatory Visit (HOSPITAL_COMMUNITY): Payer: Self-pay

## 2022-09-20 ENCOUNTER — Other Ambulatory Visit (HOSPITAL_COMMUNITY): Payer: Self-pay

## 2022-09-20 ENCOUNTER — Other Ambulatory Visit: Payer: Self-pay

## 2022-09-20 ENCOUNTER — Other Ambulatory Visit: Payer: Self-pay | Admitting: Pharmacist

## 2022-09-20 MED ORDER — ENBREL SURECLICK 50 MG/ML ~~LOC~~ SOAJ: SUBCUTANEOUS | 0 refills | Status: DC

## 2022-09-20 MED ORDER — ENBREL SURECLICK 50 MG/ML ~~LOC~~ SOAJ
SUBCUTANEOUS | 0 refills | Status: DC
  Filled 2022-09-20: qty 4, 28d supply, fill #0

## 2022-09-25 DIAGNOSIS — H40013 Open angle with borderline findings, low risk, bilateral: Secondary | ICD-10-CM | POA: Diagnosis not present

## 2022-10-12 ENCOUNTER — Other Ambulatory Visit (HOSPITAL_COMMUNITY): Payer: Self-pay

## 2022-10-13 ENCOUNTER — Other Ambulatory Visit (HOSPITAL_COMMUNITY): Payer: Self-pay

## 2022-10-13 ENCOUNTER — Other Ambulatory Visit: Payer: Self-pay | Admitting: Family

## 2022-10-13 ENCOUNTER — Other Ambulatory Visit: Payer: Self-pay | Admitting: Pharmacist

## 2022-10-13 ENCOUNTER — Other Ambulatory Visit: Payer: Self-pay

## 2022-10-13 MED ORDER — ALBUTEROL SULFATE HFA 108 (90 BASE) MCG/ACT IN AERS
2.0000 | INHALATION_SPRAY | Freq: Four times a day (QID) | RESPIRATORY_TRACT | 4 refills | Status: DC | PRN
  Filled 2022-10-13: qty 6.7, 25d supply, fill #0
  Filled 2022-12-12: qty 6.7, 25d supply, fill #1
  Filled 2023-01-26: qty 6.7, 25d supply, fill #2

## 2022-10-13 MED ORDER — ENBREL SURECLICK 50 MG/ML ~~LOC~~ SOAJ
SUBCUTANEOUS | 0 refills | Status: DC
  Filled 2022-10-13: qty 4, 28d supply, fill #0

## 2022-10-13 MED ORDER — ENBREL SURECLICK 50 MG/ML ~~LOC~~ SOAJ: SUBCUTANEOUS | 0 refills | Status: DC

## 2022-10-15 ENCOUNTER — Other Ambulatory Visit (HOSPITAL_COMMUNITY): Payer: Self-pay

## 2022-10-16 ENCOUNTER — Other Ambulatory Visit (HOSPITAL_COMMUNITY): Payer: Self-pay

## 2022-10-17 ENCOUNTER — Other Ambulatory Visit: Payer: Self-pay

## 2022-10-17 ENCOUNTER — Other Ambulatory Visit (HOSPITAL_COMMUNITY): Payer: Self-pay

## 2022-10-17 DIAGNOSIS — L821 Other seborrheic keratosis: Secondary | ICD-10-CM | POA: Diagnosis not present

## 2022-10-17 DIAGNOSIS — Z411 Encounter for cosmetic surgery: Secondary | ICD-10-CM | POA: Diagnosis not present

## 2022-10-17 DIAGNOSIS — L720 Epidermal cyst: Secondary | ICD-10-CM | POA: Diagnosis not present

## 2022-10-17 DIAGNOSIS — L820 Inflamed seborrheic keratosis: Secondary | ICD-10-CM | POA: Diagnosis not present

## 2022-10-17 MED ORDER — TRETINOIN 0.025 % EX CREA
TOPICAL_CREAM | Freq: Every evening | CUTANEOUS | 3 refills | Status: DC
  Filled 2022-10-17: qty 45, 30d supply, fill #0

## 2022-10-18 ENCOUNTER — Other Ambulatory Visit (HOSPITAL_COMMUNITY): Payer: Self-pay

## 2022-10-23 DIAGNOSIS — R87610 Atypical squamous cells of undetermined significance on cytologic smear of cervix (ASC-US): Secondary | ICD-10-CM | POA: Diagnosis not present

## 2022-10-23 DIAGNOSIS — Z01419 Encounter for gynecological examination (general) (routine) without abnormal findings: Secondary | ICD-10-CM | POA: Diagnosis not present

## 2022-10-23 DIAGNOSIS — Z01411 Encounter for gynecological examination (general) (routine) with abnormal findings: Secondary | ICD-10-CM | POA: Diagnosis not present

## 2022-10-23 DIAGNOSIS — E041 Nontoxic single thyroid nodule: Secondary | ICD-10-CM | POA: Diagnosis not present

## 2022-10-23 DIAGNOSIS — Z1231 Encounter for screening mammogram for malignant neoplasm of breast: Secondary | ICD-10-CM | POA: Diagnosis not present

## 2022-10-23 DIAGNOSIS — Z6829 Body mass index (BMI) 29.0-29.9, adult: Secondary | ICD-10-CM | POA: Diagnosis not present

## 2022-10-23 DIAGNOSIS — R102 Pelvic and perineal pain: Secondary | ICD-10-CM | POA: Diagnosis not present

## 2022-10-23 LAB — HM PAP SMEAR

## 2022-10-24 ENCOUNTER — Other Ambulatory Visit: Payer: Self-pay | Admitting: Obstetrics & Gynecology

## 2022-10-24 DIAGNOSIS — R102 Pelvic and perineal pain: Secondary | ICD-10-CM

## 2022-11-06 DIAGNOSIS — M459 Ankylosing spondylitis of unspecified sites in spine: Secondary | ICD-10-CM | POA: Diagnosis not present

## 2022-11-06 DIAGNOSIS — M4690 Unspecified inflammatory spondylopathy, site unspecified: Secondary | ICD-10-CM | POA: Diagnosis not present

## 2022-11-06 DIAGNOSIS — Z79899 Other long term (current) drug therapy: Secondary | ICD-10-CM | POA: Diagnosis not present

## 2022-11-06 DIAGNOSIS — Z227 Latent tuberculosis: Secondary | ICD-10-CM | POA: Diagnosis not present

## 2022-11-07 ENCOUNTER — Other Ambulatory Visit (HOSPITAL_COMMUNITY): Payer: Self-pay

## 2022-11-07 ENCOUNTER — Ambulatory Visit
Admission: RE | Admit: 2022-11-07 | Discharge: 2022-11-07 | Disposition: A | Discharge status: Home / Self Care | Payer: 59 | Source: Ambulatory Visit | Attending: Obstetrics & Gynecology | Admitting: Obstetrics & Gynecology

## 2022-11-07 DIAGNOSIS — R102 Pelvic and perineal pain: Secondary | ICD-10-CM

## 2022-11-08 DIAGNOSIS — N6452 Nipple discharge: Secondary | ICD-10-CM | POA: Diagnosis not present

## 2022-11-08 DIAGNOSIS — R102 Pelvic and perineal pain: Secondary | ICD-10-CM | POA: Diagnosis not present

## 2022-11-09 ENCOUNTER — Other Ambulatory Visit: Payer: Self-pay | Admitting: Obstetrics & Gynecology

## 2022-11-09 ENCOUNTER — Other Ambulatory Visit (HOSPITAL_COMMUNITY): Payer: Self-pay

## 2022-11-09 DIAGNOSIS — N6452 Nipple discharge: Secondary | ICD-10-CM

## 2022-11-10 ENCOUNTER — Other Ambulatory Visit (HOSPITAL_COMMUNITY): Payer: Self-pay

## 2022-11-13 ENCOUNTER — Other Ambulatory Visit: Payer: Self-pay | Admitting: Pharmacist

## 2022-11-13 ENCOUNTER — Other Ambulatory Visit (HOSPITAL_COMMUNITY): Payer: Self-pay

## 2022-11-13 ENCOUNTER — Other Ambulatory Visit: Payer: Self-pay

## 2022-11-13 MED ORDER — ENBREL SURECLICK 50 MG/ML ~~LOC~~ SOAJ
SUBCUTANEOUS | 0 refills | Status: DC
  Filled 2022-11-13: qty 4, 28d supply, fill #0

## 2022-11-13 MED ORDER — ENBREL SURECLICK 50 MG/ML ~~LOC~~ SOAJ: SUBCUTANEOUS | 0 refills | Status: DC

## 2022-11-15 ENCOUNTER — Ambulatory Visit (INDEPENDENT_AMBULATORY_CARE_PROVIDER_SITE_OTHER): Payer: 59 | Admitting: Internal Medicine

## 2022-11-15 ENCOUNTER — Other Ambulatory Visit (HOSPITAL_COMMUNITY): Payer: Self-pay

## 2022-11-15 ENCOUNTER — Encounter: Payer: Self-pay | Admitting: Internal Medicine

## 2022-11-15 VITALS — BP 122/80 | HR 78 | Ht 61.5 in | Wt 154.0 lb

## 2022-11-15 DIAGNOSIS — E042 Nontoxic multinodular goiter: Secondary | ICD-10-CM

## 2022-11-15 LAB — TSH: TSH: 0.92 u[IU]/mL (ref 0.35–5.50)

## 2022-11-15 LAB — T3, FREE: T3, Free: 3.2 pg/mL (ref 2.3–4.2)

## 2022-11-15 LAB — T4, FREE: Free T4: 0.79 ng/dL (ref 0.60–1.60)

## 2022-11-15 NOTE — Progress Notes (Signed)
Name: Jamie Melton  MRN/ DOB: [id number], 1971-03-13    Age/ Sex: 52 y.o., female    PCP: Dulce Sellar, NP   Reason for Endocrinology Evaluation: MNG     Date of Initial Endocrinology Evaluation: 11/15/2022     HPI: Jamie Melton is a 52 y.o. female with a past medical history of ankylosing spondylitis, psoriatic arthritis, RA. The patient presented for initial endocrinology clinic visit on 11/15/2022 for consultative assistance with her MNG.   Patient has been diagnosed with multinodular goiter based on thyroid ultrasound 11/2017   She is s/p benign FNA of the left inferior 2 cm nodule 12/2017  Patient follows with rheumatology, on methotrexate, glucocorticoids   Pt has been noted with weight loss over the past year Denies any local neck swelling  Denies dysphagia  Has occasional palpitations  Denies anxiety  Has occasional tremors  Denies diarrhea, has more constipation  No Biotin   Mother- hyperthyroidism  and sister - hyperthyroidism  Two cousins with thyroid disease as well     HISTORY:  Past Medical History:  Past Medical History:  Diagnosis Date   Acute non-recurrent frontal sinusitis 08/30/2021   Asthma    DR KOSLOW   Asthma 08/02/2011   Chest pain 08/02/2011   Fibroid 2012   Fibromyalgia 02/15/2022   GERD (gastroesophageal reflux disease)    Headache(784.0)    FREQUENT   Infection    UTI   Pain in joint, multiple sites    Palpable abd. aorta 08/02/2011   PROM (premature rupture of membranes) at term 03/29/2013   Spondylosis    Thrombocytopenia, unspecified 03/29/2013   Platelets 88 on admission. 140 at 28 weeks 218 at [facility].   Unspecified abortion, complicated by metabolic disorder, unspecified    Vaginal delivery 04/01/2013   Past Surgical History:  Past Surgical History:  Procedure Laterality Date   APPENDECTOMY     BILATERAL SALPINGECTOMY Bilateral 03/30/2013   Procedure: BILATERAL SALPINGECTOMY;  Surgeon: Hal Morales, MD;  Location: [facility];  Service: Obstetrics;  Laterality: Bilateral;   CESAREAN SECTION N/A 03/30/2013   Procedure: Primary CESAREAN SECTION of baby  boy at 0521 APGAR 8/9;  Surgeon: Hal Morales, MD;  Location: [facility];  Service: Obstetrics;  Laterality: N/A;   NASAL SINUS SURGERY      Social History:  reports that she has never smoked. She has been exposed to tobacco smoke. She has never used smokeless tobacco. She reports current alcohol use of about 3.0 standard drinks of alcohol per week. She reports that she does not use drugs. Family History: family history includes Arthritis in her mother; Hypertension in her father; Kidney disease in her mother; Thyroid disease in her mother.   HOME MEDICATIONS: Allergies as of 11/15/2022   No Known Allergies      Medication List        Accurate as of November 15, 2022  2:02 PM. If you have any questions, ask your nurse or doctor.          STOP taking these medications    amoxicillin 250 MG capsule Commonly known as: AMOXIL Stopped by: Scarlette Shorts, MD   cefadroxil 500 MG capsule Commonly known as: DURICEF Stopped by: Scarlette Shorts, MD   oxyCODONE-acetaminophen 5-325 MG tablet Commonly known as: PERCOCET/ROXICET Stopped by: Scarlette Shorts, MD   traMADol 50 MG tablet Commonly known as: ULTRAM Stopped by: Scarlette Shorts, MD       TAKE these  medications    albuterol 108 (90 Base) MCG/ACT inhaler Commonly known as: VENTOLIN HFA Inhale 2 puffs into the lungs every 6 (six) hours as needed.   ammonium lactate 12 % lotion Commonly known as: LAC-HYDRIN Apply 1 Application topically as needed for dry skin.   budesonide-formoterol 80-4.5 MCG/ACT inhaler Commonly known as: SYMBICORT Inhale 1 puff into the lungs 2 (two) times daily. If still symptomatic after 1 week, do 2 puffs 2 times daily   chlorhexidine 0.12 % solution Commonly known as: PERIDEX Rinse with 1/2 ounce for 30 seconds twice a day   Enbrel SureClick  50 MG/ML injection Generic drug: etanercept as directed Subcutaneous inject 50 mg SQ every week 30 days   fluticasone 50 MCG/ACT nasal spray Commonly known as: FLONASE Place 2 sprays into both nostrils daily.   Liquid Calcium with D3 600-12.5 MG-MCG Caps Generic drug: Calcium Carb-Cholecalciferol   multivitamin with minerals Tabs tablet Take 1 tablet by mouth daily.   polyethylene glycol-electrolytes 420 g solution Commonly known as: NuLYTELY Use as directed   tretinoin 0.025 % cream Commonly known as: RETIN-A Apply 1 application topically every evening to face.          REVIEW OF SYSTEMS: A comprehensive ROS was conducted with the patient and is negative except as per HPI    OBJECTIVE:  VS: BP 122/80 (BP Location: Left Arm, Patient Position: Sitting, Cuff Size: Small)   Pulse 78   Ht 5' 1.5" (1.562 m)   Wt 154 lb (69.9 kg)   SpO2 99%   BMI 28.63 kg/m    Wt Readings from Last 3 Encounters:  11/15/22 154 lb (69.9 kg)  02/15/22 157 lb 9.6 oz (71.5 kg)  02/08/22 158 lb 2 oz (71.7 kg)     EXAM: General: Pt appears well and is in NAD  Eyes: External eye exam normal without stare, lid lag or exophthalmos.  Neck: General: Supple without adenopathy. Thyroid: Thyroid size normal.  No goiter or nodules appreciated.  Lungs: Clear with good BS bilat   Heart: Auscultation: RRR.  Abdomen: soft, nontender  Extremities:  BL LE: No pretibial edema   Mental Status: Judgment, insight: Intact Orientation: Oriented to time, place, and person Mood and affect: No depression, anxiety, or agitation     DATA REVIEWED:     Latest Reference Range & Units 11/15/22 14:25  TSH 0.35 - 5.50 uIU/mL 0.92  Triiodothyronine,Free,Serum 2.3 - 4.2 pg/mL 3.2  T4,Free(Direct) 0.60 - 1.60 ng/dL 1.61    Thyroid Ultrasound 05/24/2020  Estimated total number of nodules >/= 1 cm: 3   Number of spongiform nodules >/=  2 cm not described below (TR1): 0   Number of mixed cystic and solid  nodules >/= 1.5 cm not described below (TR2): 0   _________________________________________________________   The approximately 1.1 x 1.1 x 0.7 cm partially cystic though predominantly solid nodule within left side of the thyroid isthmus (labeled 1), is unchanged compared to the 11/2017 examination,previously, 1.0 cm, and again does not meet criteria to recommend percutaneous sampling or continued dedicated follow-up   The approximately 1.3 x 0.8 x 0.6 cm partially cystic, partially solid nodule within the mid aspect the left lobe of the thyroid (labeled 2), is grossly unchanged compared to the 11/2017 examination, previously, 1.2 cm, and again does not meet criteria to recommend percutaneous sampling or continued dedicated follow-up   Previously questioned 1.5 cm isoechoic nodule/pseudonodule within mid aspect the left lobe of the thyroid is less conspicuous on the present  examination, currently approximately 1.3 cm and as such no longer meets imaging criteria to recommend percutaneous sampling or continued dedicated follow-up.   Previously biopsied approximately 2.2 x 1.7 x 1.0 cm nodule within the mid/inferior aspect of the left lobe of the thyroid (labeled 3) is unchanged compared to the 11/2017 examination, previously, 2.0 x 1.7 x 1.2 cm. Correlation previous biopsy results is advised.   IMPRESSION: 1. Similar findings of multinodular goiter. No definitive worrisome new or enlarging thyroid nodules. 2. Previously biopsied 2.2 cm nodule within the left lobe of the thyroid is grossly unchanged compared to the 11/2017 examination. Correlation with previous biopsy results is advised. Assuming a benign pathologic diagnosis, repeat sampling and/or continued dedicated follow-up is not recommended. 3. Previously questioned 1.5 cm isoechoic nodule within mid aspect of the left lobe of the thyroid is less conspicuous on the present examination and thus is favored to have represented a  pseudonodule and no longer meets imaging criteria to recommend continued dedicated follow-up.  FNA left inferior 2 cm nodule 6//2019  THYROID, FINE NEEDLE ASPIRARION, LEFT INFERIOR (SPECIMEN 1 OF 1 COLLECTED 12-25-2017) CONSISTENT WITH BENIGN FOLLICULAR NODULE (BETHESDA CATEGORY II). Old records , labs and images have been reviewed.   ASSESSMENT/PLAN/RECOMMENDATIONS:   Multinodular goiter:  -No local neck symptoms -TFTs are normal -She is s/p benign FNA of the left thyroid nodule in 2019 -Repeat thyroid ultrasound shows stability in 2021, this would be her fifth year, we discussed recommendations of following thyroid nodules for up to 5 years for stability -Will proceed with thyroid ultrasound   Follow-up in 1 year  Signed electronically by: Lyndle Herrlich, MD  [facility] Endocr[facility]  [facility] [address] [place] [place], [place] [address] Phone: [phone] FAX: [phone]   CC: Dulce Sellar, NP [address] [place] [address] Phone: [phone] Fax: [phone]   Return to Endocrinology clinic as below: Future Appointments  Date Time Provider Department Center  11/24/2022 10:10 AM [facility] DIAG [place] 2 [facility]  11/24/2022 10:20 AM [facility] [place] 2 [facility]  02/19/2023  9:00 AM Dulce Sellar, NP [facility]

## 2022-11-16 ENCOUNTER — Other Ambulatory Visit (HOSPITAL_COMMUNITY): Payer: Self-pay

## 2022-11-21 ENCOUNTER — Other Ambulatory Visit (HOSPITAL_COMMUNITY): Payer: Self-pay

## 2022-11-22 ENCOUNTER — Other Ambulatory Visit (HOSPITAL_COMMUNITY): Payer: Self-pay

## 2022-11-22 DIAGNOSIS — J45998 Other asthma: Secondary | ICD-10-CM | POA: Diagnosis not present

## 2022-11-22 DIAGNOSIS — J4540 Moderate persistent asthma, uncomplicated: Secondary | ICD-10-CM | POA: Diagnosis not present

## 2022-11-22 DIAGNOSIS — R052 Subacute cough: Secondary | ICD-10-CM | POA: Diagnosis not present

## 2022-11-22 DIAGNOSIS — J301 Allergic rhinitis due to pollen: Secondary | ICD-10-CM | POA: Diagnosis not present

## 2022-11-22 DIAGNOSIS — J3089 Other allergic rhinitis: Secondary | ICD-10-CM | POA: Diagnosis not present

## 2022-11-22 MED ORDER — OLOPATADINE HCL 0.2 % OP SOLN
1.0000 [drp] | Freq: Every day | OPHTHALMIC | 5 refills | Status: DC | PRN
  Filled 2022-11-22: qty 2.5, 30d supply, fill #0
  Filled 2022-12-12 – 2023-06-29 (×2): qty 2.5, 50d supply, fill #0

## 2022-11-22 MED ORDER — LEVOCETIRIZINE DIHYDROCHLORIDE 5 MG PO TABS
5.0000 mg | ORAL_TABLET | Freq: Every evening | ORAL | 5 refills | Status: DC
  Filled 2022-11-22: qty 30, 30d supply, fill #0
  Filled 2022-12-29: qty 30, 30d supply, fill #1
  Filled 2023-01-26: qty 30, 30d supply, fill #2
  Filled 2023-03-13: qty 30, 30d supply, fill #3

## 2022-11-23 ENCOUNTER — Other Ambulatory Visit (HOSPITAL_COMMUNITY): Payer: Self-pay

## 2022-11-24 ENCOUNTER — Ambulatory Visit
Admission: RE | Admit: 2022-11-24 | Discharge: 2022-11-24 | Disposition: A | Discharge status: Home / Self Care | Payer: 59 | Source: Ambulatory Visit | Attending: Obstetrics & Gynecology | Admitting: Obstetrics & Gynecology

## 2022-11-24 DIAGNOSIS — N6452 Nipple discharge: Secondary | ICD-10-CM

## 2022-11-27 ENCOUNTER — Ambulatory Visit
Admission: RE | Admit: 2022-11-27 | Discharge: 2022-11-27 | Disposition: A | Discharge status: Home / Self Care | Payer: 59 | Source: Ambulatory Visit | Attending: Internal Medicine | Admitting: Internal Medicine

## 2022-11-27 DIAGNOSIS — R911 Solitary pulmonary nodule: Secondary | ICD-10-CM | POA: Diagnosis not present

## 2022-11-27 DIAGNOSIS — E042 Nontoxic multinodular goiter: Secondary | ICD-10-CM

## 2022-11-27 DIAGNOSIS — E049 Nontoxic goiter, unspecified: Secondary | ICD-10-CM | POA: Diagnosis not present

## 2022-12-07 ENCOUNTER — Other Ambulatory Visit (HOSPITAL_COMMUNITY): Payer: Self-pay

## 2022-12-08 DIAGNOSIS — N6452 Nipple discharge: Secondary | ICD-10-CM | POA: Diagnosis not present

## 2022-12-09 ENCOUNTER — Other Ambulatory Visit: Payer: Self-pay | Admitting: Internal Medicine

## 2022-12-11 ENCOUNTER — Other Ambulatory Visit (HOSPITAL_COMMUNITY): Payer: Self-pay

## 2022-12-12 ENCOUNTER — Other Ambulatory Visit (HOSPITAL_COMMUNITY): Payer: Self-pay

## 2022-12-12 ENCOUNTER — Other Ambulatory Visit: Payer: Self-pay | Admitting: Pharmacist

## 2022-12-12 ENCOUNTER — Other Ambulatory Visit: Payer: Self-pay

## 2022-12-12 MED ORDER — ENBREL SURECLICK 50 MG/ML ~~LOC~~ SOAJ: SUBCUTANEOUS | 0 refills | Status: DC

## 2022-12-12 MED ORDER — ENBREL SURECLICK 50 MG/ML ~~LOC~~ SOAJ
SUBCUTANEOUS | 0 refills | Status: DC
  Filled 2022-12-12: qty 4, 28d supply, fill #0

## 2022-12-13 ENCOUNTER — Other Ambulatory Visit (HOSPITAL_COMMUNITY): Payer: Self-pay

## 2022-12-14 ENCOUNTER — Other Ambulatory Visit (HOSPITAL_COMMUNITY): Payer: Self-pay

## 2022-12-29 ENCOUNTER — Other Ambulatory Visit (HOSPITAL_COMMUNITY): Payer: Self-pay

## 2023-01-05 ENCOUNTER — Other Ambulatory Visit (HOSPITAL_COMMUNITY): Payer: Self-pay

## 2023-01-08 ENCOUNTER — Other Ambulatory Visit (HOSPITAL_COMMUNITY): Payer: Self-pay

## 2023-01-10 ENCOUNTER — Other Ambulatory Visit (HOSPITAL_COMMUNITY): Payer: Self-pay

## 2023-01-10 ENCOUNTER — Other Ambulatory Visit: Payer: Self-pay

## 2023-01-10 ENCOUNTER — Other Ambulatory Visit: Payer: Self-pay | Admitting: Pharmacist

## 2023-01-10 MED ORDER — ENBREL SURECLICK 50 MG/ML ~~LOC~~ SOAJ
SUBCUTANEOUS | 0 refills | Status: DC
  Filled 2023-01-10: qty 4, 28d supply, fill #0

## 2023-01-10 MED ORDER — ENBREL SURECLICK 50 MG/ML ~~LOC~~ SOAJ: SUBCUTANEOUS | 0 refills | Status: DC

## 2023-01-26 ENCOUNTER — Other Ambulatory Visit: Payer: Self-pay

## 2023-01-30 ENCOUNTER — Other Ambulatory Visit (HOSPITAL_COMMUNITY): Payer: Self-pay

## 2023-01-30 ENCOUNTER — Other Ambulatory Visit: Payer: Self-pay

## 2023-01-30 ENCOUNTER — Other Ambulatory Visit: Payer: Self-pay | Admitting: Pharmacist

## 2023-01-30 MED ORDER — ENBREL SURECLICK 50 MG/ML ~~LOC~~ SOAJ: SUBCUTANEOUS | 0 refills | Status: DC

## 2023-01-30 MED ORDER — ENBREL SURECLICK 50 MG/ML ~~LOC~~ SOAJ
SUBCUTANEOUS | 0 refills | Status: DC
  Filled 2023-01-30 – 2023-02-01 (×2): qty 4, 28d supply, fill #0

## 2023-01-31 ENCOUNTER — Other Ambulatory Visit (HOSPITAL_COMMUNITY): Payer: Self-pay

## 2023-02-01 ENCOUNTER — Other Ambulatory Visit: Payer: Self-pay

## 2023-02-01 ENCOUNTER — Other Ambulatory Visit (HOSPITAL_COMMUNITY): Payer: Self-pay

## 2023-02-02 ENCOUNTER — Other Ambulatory Visit (HOSPITAL_COMMUNITY): Payer: Self-pay

## 2023-02-19 ENCOUNTER — Encounter: Payer: Commercial Managed Care - PPO | Admitting: Family

## 2023-02-21 ENCOUNTER — Encounter (INDEPENDENT_AMBULATORY_CARE_PROVIDER_SITE_OTHER): Payer: Self-pay

## 2023-02-26 ENCOUNTER — Other Ambulatory Visit (HOSPITAL_COMMUNITY): Payer: Self-pay

## 2023-03-05 ENCOUNTER — Other Ambulatory Visit (HOSPITAL_COMMUNITY): Payer: Self-pay

## 2023-03-05 ENCOUNTER — Other Ambulatory Visit: Payer: Self-pay | Admitting: Pharmacist

## 2023-03-05 MED ORDER — ENBREL SURECLICK 50 MG/ML ~~LOC~~ SOAJ: SUBCUTANEOUS | 0 refills | Status: DC

## 2023-03-05 MED ORDER — ENBREL SURECLICK 50 MG/ML ~~LOC~~ SOAJ
SUBCUTANEOUS | 0 refills | Status: DC
  Filled 2023-03-05: qty 4, 28d supply, fill #0

## 2023-03-06 ENCOUNTER — Other Ambulatory Visit: Payer: Self-pay

## 2023-03-12 ENCOUNTER — Encounter: Payer: Commercial Managed Care - PPO | Admitting: Family

## 2023-03-13 ENCOUNTER — Other Ambulatory Visit (HOSPITAL_COMMUNITY): Payer: Self-pay

## 2023-03-16 ENCOUNTER — Encounter: Payer: Self-pay | Admitting: Family

## 2023-03-16 ENCOUNTER — Ambulatory Visit (INDEPENDENT_AMBULATORY_CARE_PROVIDER_SITE_OTHER): Payer: 59 | Admitting: Family

## 2023-03-16 VITALS — BP 109/73 | HR 66 | Temp 97.0°F | Ht 61.5 in | Wt 152.2 lb

## 2023-03-16 DIAGNOSIS — D721 Eosinophilia, unspecified: Secondary | ICD-10-CM

## 2023-03-16 DIAGNOSIS — Z0000 Encounter for general adult medical examination without abnormal findings: Secondary | ICD-10-CM

## 2023-03-16 DIAGNOSIS — Z1159 Encounter for screening for other viral diseases: Secondary | ICD-10-CM

## 2023-03-16 LAB — CBC WITH DIFFERENTIAL/PLATELET
Basophils Absolute: 0.1 10*3/uL (ref 0.0–0.1)
Basophils Relative: 1.1 % (ref 0.0–3.0)
Eosinophils Absolute: 0.7 10*3/uL (ref 0.0–0.7)
Eosinophils Relative: 11.9 % — ABNORMAL HIGH (ref 0.0–5.0)
HCT: 38.4 % (ref 36.0–46.0)
Hemoglobin: 12.5 g/dL (ref 12.0–15.0)
Lymphocytes Relative: 33.8 % (ref 12.0–46.0)
Lymphs Abs: 2 10*3/uL (ref 0.7–4.0)
MCHC: 32.6 g/dL (ref 30.0–36.0)
MCV: 97.8 fl (ref 78.0–100.0)
Monocytes Absolute: 0.4 10*3/uL (ref 0.1–1.0)
Monocytes Relative: 6.1 % (ref 3.0–12.0)
Neutro Abs: 2.8 10*3/uL (ref 1.4–7.7)
Neutrophils Relative %: 47.1 % (ref 43.0–77.0)
Platelets: 220 10*3/uL (ref 150.0–400.0)
RBC: 3.93 Mil/uL (ref 3.87–5.11)
RDW: 13 % (ref 11.5–15.5)
WBC: 5.9 10*3/uL (ref 4.0–10.5)

## 2023-03-16 LAB — COMPREHENSIVE METABOLIC PANEL
ALT: 18 U/L (ref 0–35)
AST: 17 U/L (ref 0–37)
Albumin: 4 g/dL (ref 3.5–5.2)
Alkaline Phosphatase: 66 U/L (ref 39–117)
BUN: 13 mg/dL (ref 6–23)
CO2: 28 mEq/L (ref 19–32)
Calcium: 9.2 mg/dL (ref 8.4–10.5)
Chloride: 106 mEq/L (ref 96–112)
Creatinine, Ser: 0.9 mg/dL (ref 0.40–1.20)
GFR: 74.3 mL/min (ref >60.00)
Glucose, Bld: 83 mg/dL (ref 70–99)
Potassium: 4.2 mEq/L (ref 3.5–5.1)
Sodium: 139 mEq/L (ref 135–145)
Total Bilirubin: 0.3 mg/dL (ref 0.2–1.2)
Total Protein: 7.3 g/dL (ref 6.0–8.3)

## 2023-03-16 NOTE — Progress Notes (Signed)
Phone [phone]  Subjective:   Patient is a 52 y.o. female presenting for annual physical.    Chief Complaint  Patient presents with   Annual Exam    Fasting w/ labs   See problem oriented charting- ROS- full  review of systems was completed and negative except for Asthma in HPI above.   The following were reviewed and entered/updated in epic: Past Medical History:  Diagnosis Date   Acute non-recurrent frontal sinusitis 08/30/2021   Asthma    DR KOSLOW   Asthma 08/02/2011   Chest pain 08/02/2011   Fibroid 2012   Fibromyalgia 02/15/2022   GERD (gastroesophageal reflux disease)    Headache(784.0)    FREQUENT   Infection    UTI   Pain in joint, multiple sites    Palpable abd. aorta 08/02/2011   PROM (premature rupture of membranes) at term 03/29/2013   Spondylosis    Thrombocytopenia, unspecified (HCC) 03/29/2013   Platelets 88 on admission. 140 at 28 weeks 218 at [facility].   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   Inflammatory arthritis 02/15/2022   Ankylosing spondylitis (HCC) 02/08/2022   Moderate persistent asthma without complication 02/08/2022   Sterilization 03/30/2013   Past Surgical History:  Procedure Laterality Date   APPENDECTOMY     BILATERAL SALPINGECTOMY Bilateral 03/30/2013   Procedure: BILATERAL SALPINGECTOMY;  Surgeon: Hal Morales, MD;  Location: [facility];  Service: Obstetrics;  Laterality: Bilateral;   CESAREAN SECTION N/A 03/30/2013   Procedure: Primary CESAREAN SECTION of baby  boy at 0521 APGAR 8/9;  Surgeon: Hal Morales, MD;  Location: [facility];  Service: Obstetrics;  Laterality: N/A;   NASAL SINUS SURGERY      Family History  Problem Relation Age  of Onset   Arthritis Mother    Kidney disease Mother        STONES   Thyroid disease Mother    Hypertension Father     Medications- reviewed and updated Current Outpatient Medications  Medication Sig Dispense Refill   albuterol (VENTOLIN HFA) 108 (90 Base) MCG/ACT inhaler Inhale 2 puffs into the lungs every 6 (six) hours as needed. 6.7 g 4   ammonium lactate (LAC-HYDRIN) 12 % lotion Apply 1 Application topically as needed for dry skin. 400 g 5   budesonide-formoterol (SYMBICORT) 80-4.5 MCG/ACT inhaler Inhale 1 puff into the lungs 2 (two) times daily. If still symptomatic after 1 week, do 2 puffs 2 times daily 10.2 g 3   Calcium Carb-Cholecalciferol (LIQUID CALCIUM WITH D3) 600-12.5 MG-MCG CAPS      chlorhexidine (PERIDEX) 0.12 % solution Rinse with 1/2 ounce for 30 seconds twice a day 473 mL 1   etanercept (ENBREL SURECLICK) 50 MG/ML injection as directed inject 50 mg subcutaneously every week 30 days 4 mL 0   fluticasone (FLONASE) 50 MCG/ACT nasal spray Place 2 sprays into both nostrils daily. 16 g 6   levocetirizine (XYZAL) 5 MG tablet Take 1 tablet (5 mg total) by mouth every evening. 30 tablet 5   Multiple Vitamin (MULTIVITAMIN WITH MINERALS) TABS tablet Take 1 tablet by mouth daily.     Olopatadine HCl 0.2 % SOLN Place 1 drop into affected eye(s) once a day or  as needed 2.5 mL 5   polyethylene glycol-electrolytes (NULYTELY) 420 g solution Use as directed 4000 mL 0   tretinoin (RETIN-A) 0.025 % cream Apply 1 application topically every evening to face. 45 g 3   No current facility-administered medications for this visit.    Allergies-reviewed and updated No Known Allergies  Social History   Social History Narrative   Not on file    Objective:  BP 109/73   Pulse 66   Temp (!) 97 F (36.1 C) (Temporal)   Ht 5' 1.5" (1.562 m)   Wt 152 lb 3.2 oz (69 kg)   SpO2 97%   BMI 28.29 kg/m  Physical Exam Vitals and nursing note reviewed.  Constitutional:      Appearance:  Normal appearance.  HENT:     Head: Normocephalic.     Right Ear: Tympanic membrane normal.     Left Ear: Tympanic membrane normal.     Nose: Nose normal.     Mouth/Throat:     Mouth: Mucous membranes are moist.  Eyes:     Pupils: Pupils are equal, round, and reactive to light.  Cardiovascular:     Rate and Rhythm: Normal rate and regular rhythm.  Pulmonary:     Effort: Pulmonary effort is normal.     Breath sounds: Normal breath sounds.  Musculoskeletal:        General: Normal range of motion.     Cervical back: Normal range of motion.  Lymphadenopathy:     Cervical: No cervical adenopathy.  Skin:    General: Skin is warm and dry.  Neurological:     Mental Status: She is alert.  Psychiatric:        Mood and Affect: Mood normal.        Behavior: Behavior normal.      Assessment and Plan   Health Maintenance counseling: 1. Anticipatory guidance: Patient counseled regarding regular dental exams q6 months, eye exams,  avoiding smoking and second hand smoke, limiting alcohol to 1 beverage per day, no illicit drugs.   2. Risk factor reduction:  Advised patient of need for regular exercise and diet rich with fruits and vegetables to reduce risk of heart attack and stroke. Exercise- none.  Wt Readings from Last 3 Encounters:  03/16/23 152 lb 3.2 oz (69 kg)  11/15/22 154 lb (69.9 kg)  02/15/22 157 lb 9.6 oz (71.5 kg)   3. Immunizations/screenings/ancillary studies Immunization History  Administered Date(s) Administered   Influenza-Unspecified 07/25/2011, 04/24/2015   Moderna SARS-COV2 Booster Vaccination 08/03/2020   PFIZER(Purple Top)SARS-COV-2 Vaccination 11/06/2019   Tdap 03/25/2013   Health Maintenance Due  Topic Date Due   Hepatitis C Screening  Never done   Colonoscopy  Never done   MAMMOGRAM  03/23/2022    4. Cervical cancer screening- 2023 5. Breast cancer screening-  mammogram: 2024 6. Colon cancer screening - done, requesting records. 7. Skin cancer  screening- advised regular sunscreen use. Denies worrisome, changing, or new skin lesions.  8. Birth control/STD check- Tubal ligation/ N/A 9. Osteoporosis screening- N/A 10. Alcohol screening: rare 11. Smoking associated screening (lung cancer screening, AAA screen 65-75, UA)- non- smoker  Problem List Items Addressed This Visit   None Visit Diagnoses     Annual physical exam    -  Primary   Relevant Orders   CBC with Differential/Platelet   Comprehensive metabolic panel   Need for hepatitis C screening test       Relevant Orders   Hepatitis C Antibody  Recommended follow up:  No follow-ups on file. Future Appointments  Date Time Provider Department Center  11/16/2023  9:10 AM Shamleffer, Konrad Dolores, MD [facility] None    Lab/Order associations:fasting   Dulce Sellar, NP

## 2023-03-16 NOTE — Patient Instructions (Signed)
It was very nice to see you today!   I will review your lab results via MyChart in a few days.   Have a great weekend!    PLEASE NOTE:  If you had any lab tests please let us know if you have not heard back within a few days. You may see your results on MyChart before we have a chance to review them but we will give you a call once they are reviewed by us. If we ordered any referrals today, please let us know if you have not heard from their office within the next week.    

## 2023-03-17 LAB — HEPATITIS C ANTIBODY: Hepatitis C Ab: NONREACTIVE

## 2023-03-19 ENCOUNTER — Other Ambulatory Visit: Payer: Self-pay | Admitting: Medical Genetics

## 2023-03-19 DIAGNOSIS — Z006 Encounter for examination for normal comparison and control in clinical research program: Secondary | ICD-10-CM

## 2023-03-19 NOTE — Telephone Encounter (Signed)
Please see pt response to recent lab results and advise

## 2023-03-21 NOTE — Telephone Encounter (Signed)
lab orders entered, please schedule pt for lab only appt, non-fating, thx

## 2023-03-22 NOTE — Telephone Encounter (Signed)
Patient has been scheduled for lab only on 8/30 @ 9:30 am.

## 2023-03-23 ENCOUNTER — Telehealth: Payer: Self-pay | Admitting: Family

## 2023-03-23 ENCOUNTER — Other Ambulatory Visit: Payer: 59

## 2023-03-23 ENCOUNTER — Other Ambulatory Visit (INDEPENDENT_AMBULATORY_CARE_PROVIDER_SITE_OTHER): Payer: 59

## 2023-03-23 ENCOUNTER — Other Ambulatory Visit: Payer: Self-pay

## 2023-03-23 DIAGNOSIS — D649 Anemia, unspecified: Secondary | ICD-10-CM | POA: Diagnosis not present

## 2023-03-23 DIAGNOSIS — D721 Eosinophilia, unspecified: Secondary | ICD-10-CM

## 2023-03-23 DIAGNOSIS — Z006 Encounter for examination for normal comparison and control in clinical research program: Secondary | ICD-10-CM

## 2023-03-23 DIAGNOSIS — Z1379 Encounter for other screening for genetic and chromosomal anomalies: Secondary | ICD-10-CM

## 2023-03-23 LAB — CBC WITH DIFFERENTIAL/PLATELET
Basophils Absolute: 0 10*3/uL (ref 0.0–0.1)
Basophils Relative: 0.7 % (ref 0.0–3.0)
Eosinophils Absolute: 0.9 10*3/uL — ABNORMAL HIGH (ref 0.0–0.7)
Eosinophils Relative: 13.9 % — ABNORMAL HIGH (ref 0.0–5.0)
HCT: 37.2 % (ref 36.0–46.0)
Hemoglobin: 12.3 g/dL (ref 12.0–15.0)
Lymphocytes Relative: 29.7 % (ref 12.0–46.0)
Lymphs Abs: 1.9 10*3/uL (ref 0.7–4.0)
MCHC: 33 g/dL (ref 30.0–36.0)
MCV: 97.1 fl (ref 78.0–100.0)
Monocytes Absolute: 0.4 10*3/uL (ref 0.1–1.0)
Monocytes Relative: 6.4 % (ref 3.0–12.0)
Neutro Abs: 3.1 10*3/uL (ref 1.4–7.7)
Neutrophils Relative %: 49.3 % (ref 43.0–77.0)
Platelets: 196 10*3/uL (ref 150.0–400.0)
RBC: 3.83 Mil/uL — ABNORMAL LOW (ref 3.87–5.11)
RDW: 12.5 % (ref 11.5–15.5)
WBC: 6.3 10*3/uL (ref 4.0–10.5)

## 2023-03-23 NOTE — Telephone Encounter (Signed)
Debbie at [facility] requests to be called re: Lab Orders that were placed for Patient (who is currently at the Lab) must be futured.  Patient requests Genetic Helix test order be placed/added

## 2023-03-23 NOTE — Telephone Encounter (Signed)
I returned call from [place] at [facility] in regards to labs below. Informed Debbie PCP does not order this lab test.

## 2023-03-27 ENCOUNTER — Encounter: Payer: Self-pay | Admitting: Family

## 2023-03-27 DIAGNOSIS — D721 Eosinophilia, unspecified: Secondary | ICD-10-CM

## 2023-03-27 LAB — PATHOLOGIST SMEAR REVIEW

## 2023-03-28 IMAGING — DX DG LUMBAR SPINE COMPLETE 4+V
5 series · 5 of 5 positions shown · non-contrast
Comparison: None.

CLINICAL DATA: Low back pain

EXAM:
LUMBAR SPINE - COMPLETE 4+ VIEW

[l-spine ap]
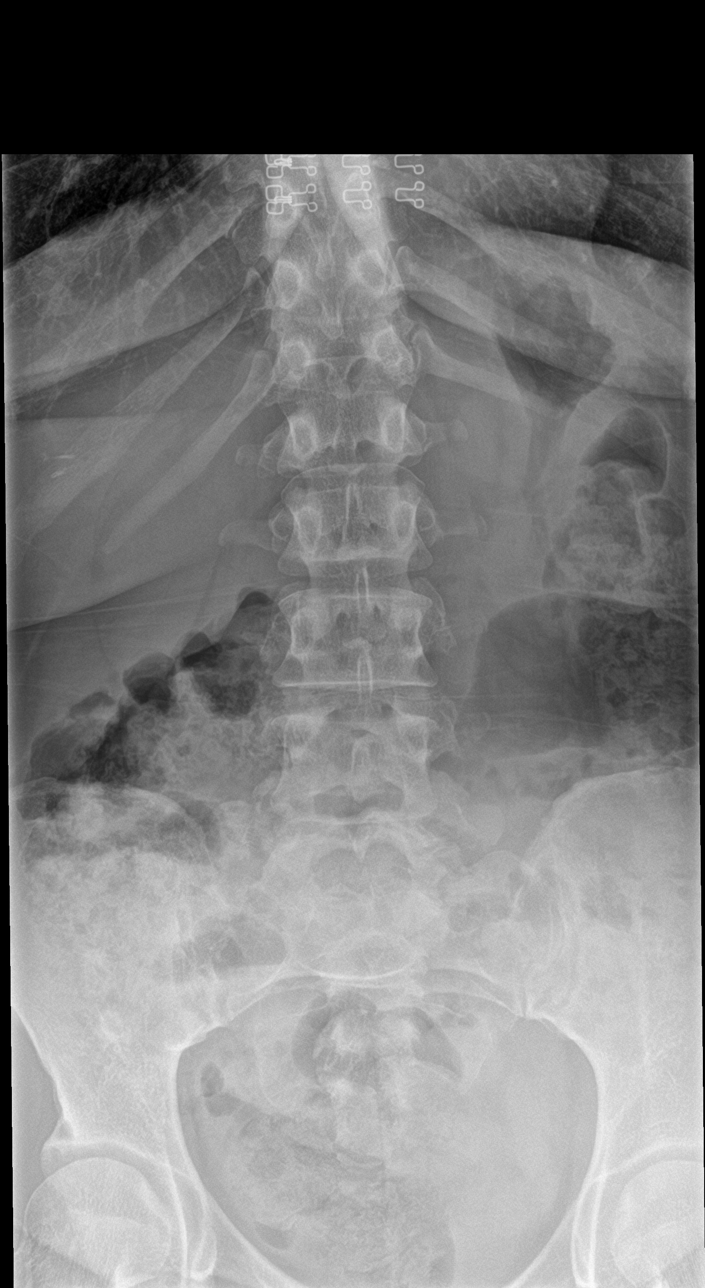

[l-spine obl (1 of 2)]
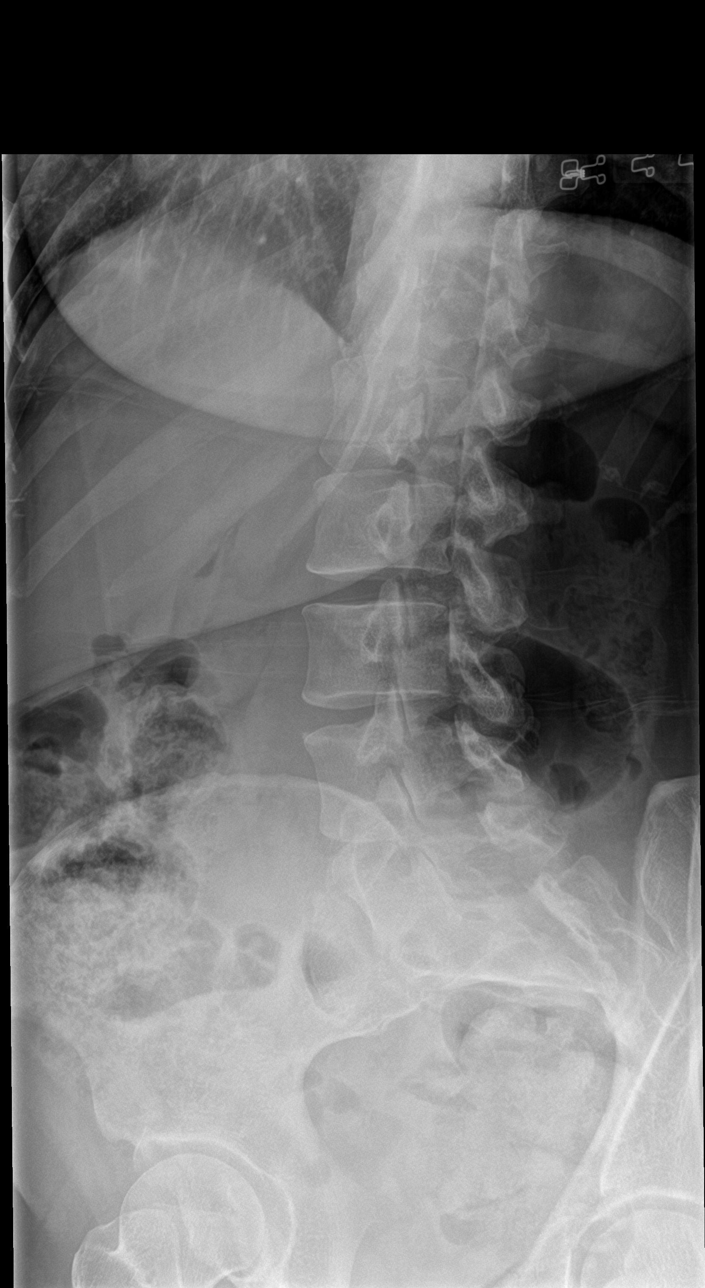

[l-spine obl (2 of 2)]
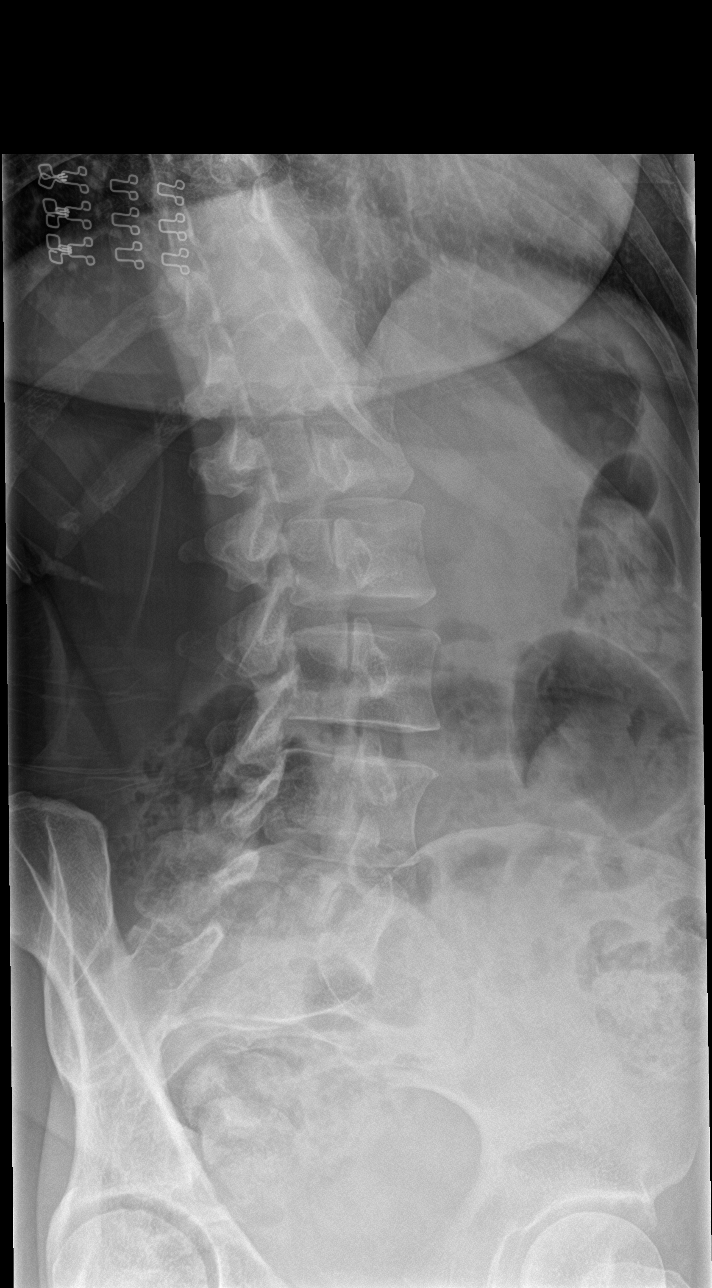

[l-spine lat]
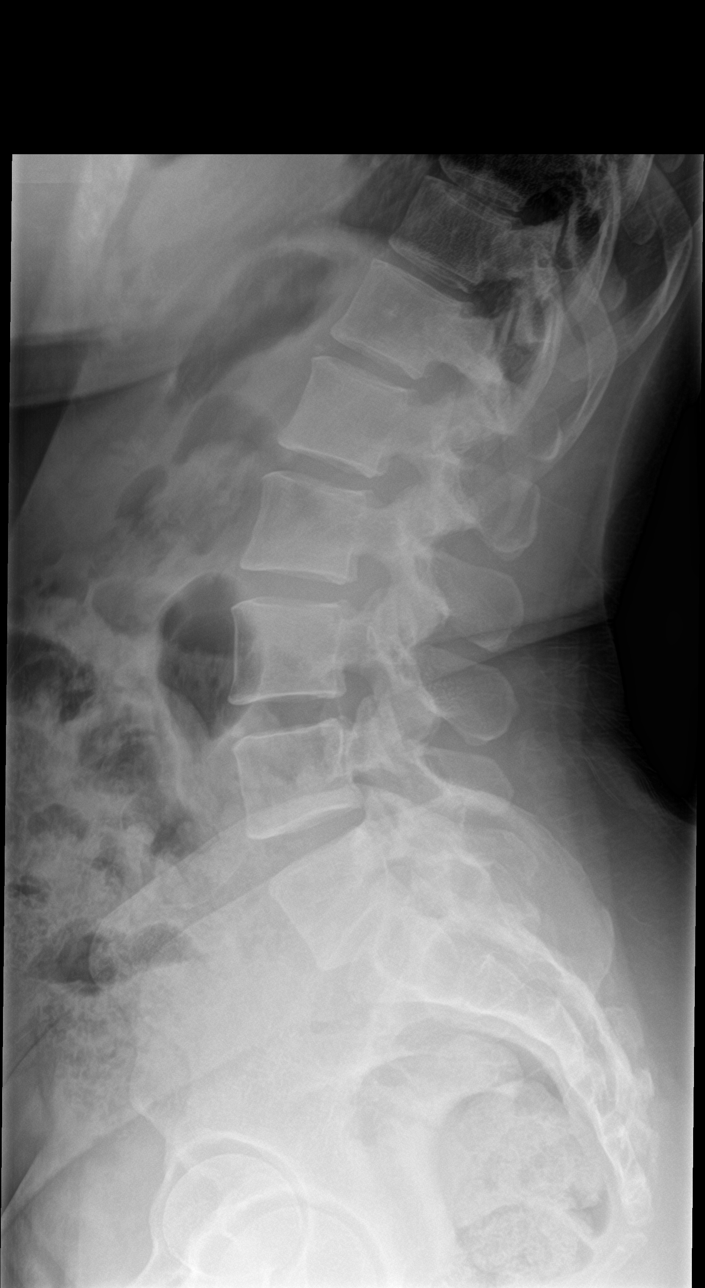

[l-spine spot]
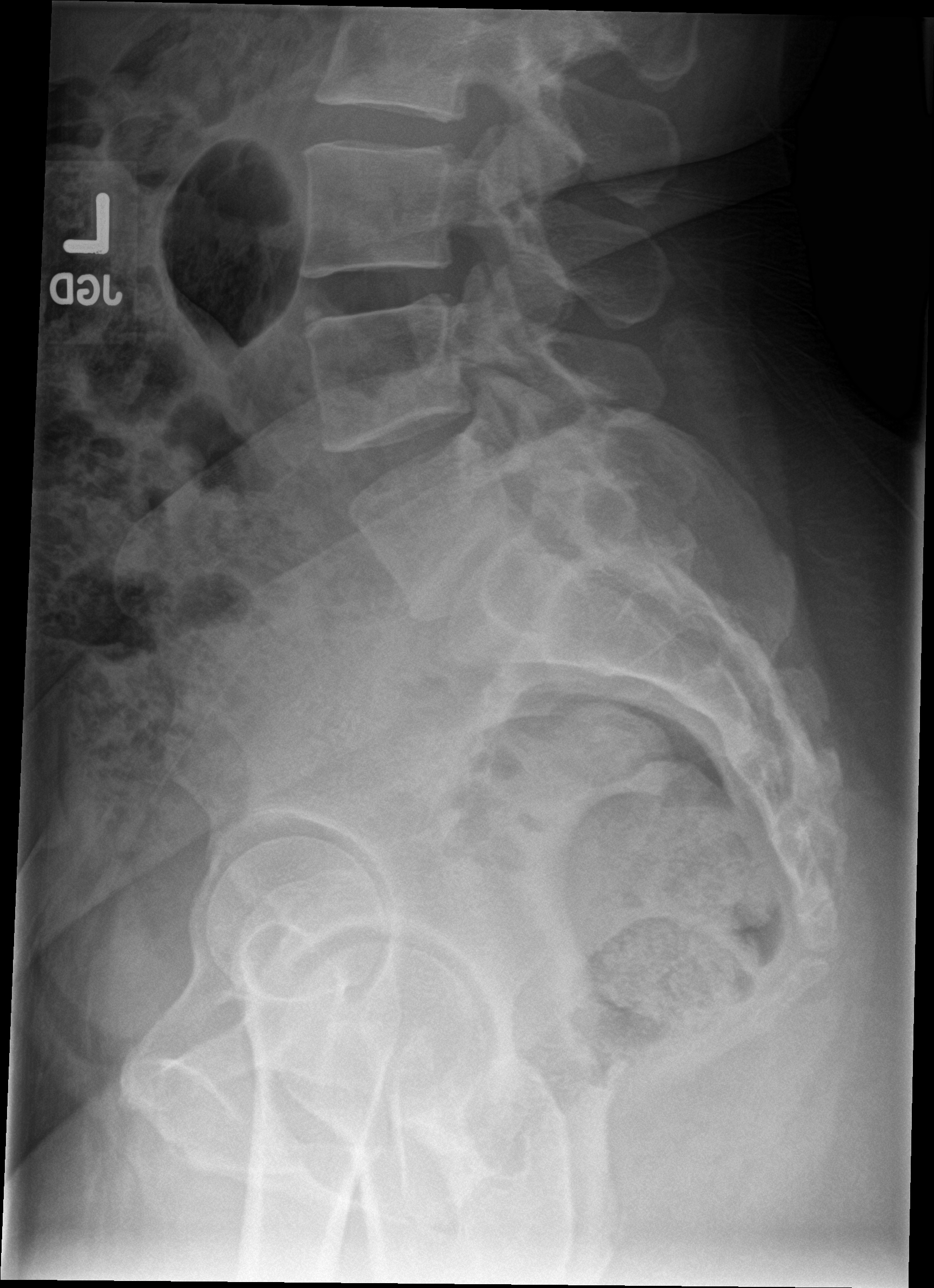

[5 of 5 positions shown; findings below may reference images not displayed]

FINDINGS: Frontal, lateral, spot lumbosacral lateral, and bilateral oblique
views were obtained. There are 5 non-rib-bearing lumbar type
vertebral bodies. There is mild thoracolumbar levoscoliosis. There
is no fracture or spondylolisthesis. There is moderate disc space
narrowing at L5-S1. Other disc spaces appear unremarkable. There is
mild facet osteoarthritic change at L5-S1 bilaterally. Facets at
other levels appear normal.
IMPRESSION: Mild scoliosis. Osteoarthritic change at L5-S1. Other disc spaces
appear unremarkable. No fracture or spondylolisthesis.

## 2023-04-02 ENCOUNTER — Other Ambulatory Visit (HOSPITAL_COMMUNITY): Payer: Self-pay

## 2023-04-03 ENCOUNTER — Other Ambulatory Visit: Payer: Self-pay

## 2023-04-03 ENCOUNTER — Other Ambulatory Visit (HOSPITAL_COMMUNITY): Payer: Self-pay

## 2023-04-03 MED ORDER — ENBREL SURECLICK 50 MG/ML ~~LOC~~ SOAJ: SUBCUTANEOUS | 0 refills | Status: DC

## 2023-04-04 ENCOUNTER — Other Ambulatory Visit (HOSPITAL_COMMUNITY): Payer: Self-pay

## 2023-04-04 ENCOUNTER — Other Ambulatory Visit: Payer: Self-pay

## 2023-04-04 ENCOUNTER — Other Ambulatory Visit: Payer: 59

## 2023-04-04 ENCOUNTER — Other Ambulatory Visit: Payer: Self-pay | Admitting: Pharmacist

## 2023-04-04 DIAGNOSIS — D721 Eosinophilia, unspecified: Secondary | ICD-10-CM

## 2023-04-04 DIAGNOSIS — J309 Allergic rhinitis, unspecified: Secondary | ICD-10-CM

## 2023-04-04 DIAGNOSIS — J4540 Moderate persistent asthma, uncomplicated: Secondary | ICD-10-CM

## 2023-04-04 MED ORDER — ENBREL SURECLICK 50 MG/ML ~~LOC~~ SOAJ
SUBCUTANEOUS | 0 refills | Status: DC
  Filled 2023-04-04: qty 4, 28d supply, fill #0

## 2023-04-06 ENCOUNTER — Other Ambulatory Visit (HOSPITAL_COMMUNITY): Payer: Self-pay

## 2023-04-06 LAB — H. PYLORI BREATH TEST: H. pylori Breath Test: NOT DETECTED

## 2023-04-09 DIAGNOSIS — J309 Allergic rhinitis, unspecified: Secondary | ICD-10-CM | POA: Insufficient documentation

## 2023-04-13 DIAGNOSIS — M4690 Unspecified inflammatory spondylopathy, site unspecified: Secondary | ICD-10-CM | POA: Diagnosis not present

## 2023-04-13 DIAGNOSIS — M459 Ankylosing spondylitis of unspecified sites in spine: Secondary | ICD-10-CM | POA: Diagnosis not present

## 2023-04-13 DIAGNOSIS — Z23 Encounter for immunization: Secondary | ICD-10-CM | POA: Diagnosis not present

## 2023-04-13 DIAGNOSIS — Z227 Latent tuberculosis: Secondary | ICD-10-CM | POA: Diagnosis not present

## 2023-04-13 DIAGNOSIS — Z79899 Other long term (current) drug therapy: Secondary | ICD-10-CM | POA: Diagnosis not present

## 2023-04-14 ENCOUNTER — Encounter (HOSPITAL_COMMUNITY): Payer: Self-pay

## 2023-04-23 DIAGNOSIS — H40013 Open angle with borderline findings, low risk, bilateral: Secondary | ICD-10-CM | POA: Diagnosis not present

## 2023-04-23 DIAGNOSIS — H524 Presbyopia: Secondary | ICD-10-CM | POA: Diagnosis not present

## 2023-04-23 DIAGNOSIS — H5213 Myopia, bilateral: Secondary | ICD-10-CM | POA: Diagnosis not present

## 2023-05-01 ENCOUNTER — Other Ambulatory Visit (HOSPITAL_COMMUNITY): Payer: Self-pay

## 2023-05-01 ENCOUNTER — Other Ambulatory Visit: Payer: Self-pay

## 2023-05-01 ENCOUNTER — Ambulatory Visit (INDEPENDENT_AMBULATORY_CARE_PROVIDER_SITE_OTHER): Payer: 59 | Admitting: Internal Medicine

## 2023-05-01 ENCOUNTER — Encounter: Payer: Self-pay | Admitting: Internal Medicine

## 2023-05-01 VITALS — BP 112/70 | HR 78 | Temp 98.1°F | Resp 12 | Ht 60.43 in | Wt 151.0 lb

## 2023-05-01 DIAGNOSIS — R609 Edema, unspecified: Secondary | ICD-10-CM | POA: Diagnosis not present

## 2023-05-01 DIAGNOSIS — J4540 Moderate persistent asthma, uncomplicated: Secondary | ICD-10-CM | POA: Diagnosis not present

## 2023-05-01 DIAGNOSIS — J3089 Other allergic rhinitis: Secondary | ICD-10-CM

## 2023-05-01 DIAGNOSIS — D7219 Other eosinophilia: Secondary | ICD-10-CM | POA: Diagnosis not present

## 2023-05-01 MED ORDER — BUDESONIDE-FORMOTEROL FUMARATE 80-4.5 MCG/ACT IN AERO
2.0000 | INHALATION_SPRAY | Freq: Two times a day (BID) | RESPIRATORY_TRACT | 5 refills | Status: DC
  Filled 2023-05-01: qty 10.2, 30d supply, fill #0
  Filled 2023-06-29: qty 10.2, 30d supply, fill #1
  Filled 2023-08-20: qty 10.2, 30d supply, fill #2

## 2023-05-01 MED ORDER — LEVOCETIRIZINE DIHYDROCHLORIDE 5 MG PO TABS
5.0000 mg | ORAL_TABLET | Freq: Every evening | ORAL | 5 refills | Status: DC
  Filled 2023-05-01: qty 30, 30d supply, fill #0
  Filled 2023-06-01: qty 30, 30d supply, fill #1
  Filled 2023-06-29: qty 30, 30d supply, fill #2
  Filled 2023-08-20 (×2): qty 30, 30d supply, fill #3

## 2023-05-01 MED ORDER — ALBUTEROL SULFATE HFA 108 (90 BASE) MCG/ACT IN AERS
2.0000 | INHALATION_SPRAY | Freq: Four times a day (QID) | RESPIRATORY_TRACT | 1 refills | Status: DC | PRN
  Filled 2023-05-01: qty 6.7, 25d supply, fill #0
  Filled 2023-05-27: qty 6.7, 25d supply, fill #1

## 2023-05-01 MED ORDER — FLUTICASONE PROPIONATE 50 MCG/ACT NA SUSP
2.0000 | Freq: Every day | NASAL | 6 refills | Status: DC
  Filled 2023-05-01: qty 16, 30d supply, fill #0
  Filled 2023-05-31: qty 16, 30d supply, fill #1
  Filled 2023-06-29: qty 16, 30d supply, fill #2
  Filled 2023-08-20 (×2): qty 16, 30d supply, fill #3

## 2023-05-01 NOTE — Progress Notes (Signed)
NEW PATIENT  Date of Service/Encounter:  05/01/23  Consult requested by: Dulce Sellar, NP   Subjective:   Jamie Melton (DOB: 06-Aug-1970) is a 52 y.o. female who presents to the clinic on 05/01/2023 with a chief complaint of Transfer of Care ([address] to [place].) .    History obtained from: chart review and patient.  Discussed the use of AI scribe software for clinical note transcription with the patient, who gave verbal consent to proceed.  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 She was diagnosed with asthma at the age of 9, initially presenting with wheezing. Over the years, her asthma medication has changed, and she is currently on Symbicort 80 micrograms, which she takes twice daily when symptomatic and once daily when symptoms improve. She has not required emergency care or oral prednisone for breathing issues in the past year. Has overall done well in terms of asthma.  Rarely needs albuterol.  Her allergies began around the same time as her asthma, with symptoms including sweating behind the ears, congestion, drainage, and a runny nose. These symptoms are triggered by exposure to allergens, particularly during springtime and humid weather. She was previously prescribed Xyzal for her allergies, which she finished recently, and has been using Benadryl intermittently. She also has Flonase, which she uses sporadically. The patient was allergy tested approximately five months ago, with positive results for mold, pollen, and dust. She has also had sinus or nose surgery in her twenties, where the inner part of her nose was scraped off. This procedure was performed in [place].  In addition to her respiratory issues, the patient has a history of gastrointestinal issues, thought to be Irritable Bowel Syndrome (IBS), characterized by constipation and abdominal pain. She travels to [place] every other year and has  experienced gastrointestinal illness during these trips. She has not been tested for parasitic infections but denies any chronic diarrhea/bloody stools.          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 140 at 28 weeks 218 at NOB.   Unspecified abortion, complicated by metabolic disorder, unspecified    Vaginal delivery 04/01/2013    Past Surgical History: Past Surgical History:  Procedure Laterality Date   ABDOMINOPLASTY  07/10/2022   APPENDECTOMY     BILATERAL SALPINGECTOMY Bilateral 03/30/2013   Procedure: BILATERAL SALPINGECTOMY;  Surgeon: Hal Morales, MD;  Location: [facility];  Service: Obstetrics;  Laterality: Bilateral;   CESAREAN SECTION N/A 03/30/2013   Procedure: Primary CESAREAN SECTION of baby  boy at 0521 APGAR 8/9;  Surgeon: Hal Morales, MD;  Location: [facility];  Service: Obstetrics;  Laterality: N/A;   NASAL SINUS SURGERY      Family History: Family History  Problem Relation Age of Onset   Arthritis Mother    Kidney disease Mother        STONES   Thyroid  disease Mother    Hypertension Father    Asthma Sister    Eczema Sister    Allergic rhinitis Sister    Eczema Sister    Asthma Sister    Allergic rhinitis Sister    Eczema Sister    Asthma Sister    Allergic rhinitis Sister     Social History:  Flooring in bedroom: wood Pets: none Tobacco use/exposure: none Job: nurse  Medication List:  Allergies as of 05/01/2023   No Known Allergies      Medication List         Accurate as of May 01, 2023  3:21 PM. If you have any questions, ask your nurse or doctor.          albuterol 108 (90 Base) MCG/ACT inhaler Commonly known as: VENTOLIN HFA Inhale 2 puffs into the lungs every 6 (six) hours as needed.   ammonium lactate 12 % lotion Commonly known as: LAC-HYDRIN Apply 1 Application topically as needed for dry skin.   BENADRYL ALLERGY PO Take by mouth as needed.   budesonide-formoterol 80-4.5 MCG/ACT inhaler Commonly known as: SYMBICORT Inhale 2 puffs into the lungs 2 (two) times daily. What changed: how much to take Changed by: Birder Robson   chlorhexidine 0.12 % solution Commonly known as: PERIDEX Rinse with 1/2 ounce for 30 seconds twice a day   Enbrel SureClick 50 MG/ML injection Generic drug: etanercept as directed inject 50 mg subcutaneously every week 30 days   fluticasone 50 MCG/ACT nasal spray Commonly known as: FLONASE Place 2 sprays into both nostrils daily.   levocetirizine 5 MG tablet Commonly known as: XYZAL Take 1 tablet (5 mg total) by mouth every evening.   Liquid Calcium with D3 600-12.5 MG-MCG Caps Generic drug: Calcium Carb-Cholecalciferol   multivitamin with minerals Tabs tablet Take 1 tablet by mouth daily.   Olopatadine HCl 0.2 % Soln Place 1 drop into affected eye(s) once a day or  as needed   polyethylene glycol-electrolytes 420 g solution Commonly known as: NuLYTELY Use as directed   tretinoin 0.025 % cream Commonly known as: RETIN-A Apply 1 application topically every evening to face.         REVIEW OF SYSTEMS: Pertinent positives and negatives discussed in HPI.   Objective:   Physical Exam: BP 112/70   Pulse 78   Temp 98.1 F (36.7 C) (Temporal)   Resp 12   Ht 5' 0.43" (1.535 m)   Wt 151 lb (68.5 kg)   SpO2 98%   BMI 29.07 kg/m  Body mass index is 29.07 kg/m. GEN: alert, well developed HEENT: clear conjunctiva, TM grey and translucent, nose with + moderate inferior  turbinate hypertrophy, pink nasal mucosa, slight clear rhinorrhea, no cobblestoning, no parotid swelling palpated, no cervical lymphadenopathy  HEART: regular rate and rhythm, no murmur LUNGS: clear to auscultation bilaterally, no coughing, unlabored respiration ABDOMEN: soft, non distended  SKIN: no rashes or lesions  Reviewed:  04/13/2023 seen by Dr Deanne Coffer for fibromyalgia, ankylosing spondylitis, latent TB.  On Etanercept.   11/15/2022: seen by Dr Lonzo Cloud Endo for multinodular goiter. Has benign FNA in 2019.  Stable ultrasound.  Plan to repeat ultrasound.   AEC 700-900   11/22/2022: seen by Dr Irena Cords for moderate persistent asthma, allergic rhinitis. On Symbicort, Flonase, Xyzal.   Spirometry:  Tracings reviewed. Her effort: It was hard to get consistent efforts and there is a question as to whether this reflects a maximal maneuver. FVC: 2.83L FEV1: 1.76L, 85% predicted  FEV1/FVC ratio: 62% Interpretation: Spirometry consistent with mild obstructive disease.  Please see scanned spirometry results for details.    Assessment:   1. Other allergic rhinitis   2. Moderate persistent asthma without complication   3. Other eosinophilia   4. Parotid swelling     Plan/Recommendations:  Moderate Persistent Asthma: - MDI technique discussed.   Spirometry today with mild obstruction but not the best effort; symptomatically doing well on ICS/LABA daily.  - Maintenance inhaler: continue Symbicort 80-4.73mcg 2 puffs daily - With flare ups or respiratory illness, use Symbicort 80-4.58mcg 2 puffs twice daily for 1-2 weeks.  - Rescue inhaler: Albuterol 2 puffs via spacer or 1 vial via nebulizer every 4-6 hours as needed for respiratory symptoms of cough, shortness of breath, or wheezing Asthma control goals:  Full participation in all desired activities (may need albuterol before activity) Albuterol use two times or less a week on average (not counting use with activity) Cough interfering  with sleep two times or less a month Oral steroids no more than once a year No hospitalizations  Other Allergic Rhinitis: - If symptoms worsen, will consider retesting in the future.  Saw [facility] 11/2022 and underwent allergy testing.  Unable to review results.  - Use nasal saline rinses before nose sprays such as with Neilmed Sinus Rinse.  Use distilled water.   - Use Flonase 2 sprays each nostril daily. Aim upward and outward. - Use Xyzal 5mg  daily. Okay to take a second dose if needed.  - Consider allergy shots as long term control of your symptoms by teaching your immune system to be more tolerant of your allergy triggers.   Eosinophilia - Will plan to observe for now. Will plan to repeat in 4 months at next visit.  - Likely related to atopic hx with asthma and allergic rhinitis.  Could also be sometimes seen with TNF-a blockade use.  - If rising, will consider parasitic testing in setting of international travel.   Parotid Gland Swelling - Intermittent swelling, could be related to illness or blocked ducts. - Can do warm massage or sour candy.  - If persistent or frequent, will refer to ENT for further evaluation.   Return in about 4 months (around 09/01/2023).  Alesia Morin, MD [facility]

## 2023-05-01 NOTE — Patient Instructions (Addendum)
Moderate Persistent Asthma: - Maintenance inhaler: continue Symbicort 80-4.62mcg 2 puffs daily - With flare ups or respiratory illness, use Symbicort 80-4.41mcg 2 puffs twice daily for 1-2 weeks.  - Rescue inhaler: Albuterol 2 puffs via spacer or 1 vial via nebulizer every 4-6 hours as needed for respiratory symptoms of cough, shortness of breath, or wheezing Asthma control goals:  Full participation in all desired activities (may need albuterol before activity) Albuterol use two times or less a week on average (not counting use with activity) Cough interfering with sleep two times or less a month Oral steroids no more than once a year No hospitalizations  Other Allergic Rhinitis: - If symptoms worsen, will consider retesting in the future.  Saw [facility] 11/2022 and underwent allergy testing.   - Use nasal saline rinses before nose sprays such as with Neilmed Sinus Rinse.  Use distilled water.   - Use Flonase 2 sprays each nostril daily. Aim upward and outward. - Use Xyzal 5mg  daily. Okay to take a second dose if needed.  - Consider allergy shots as long term control of your symptoms by teaching your immune system to be more tolerant of your allergy triggers.   Eosinophilia - Will plan to observe for now. Will plan to repeat in 4 months at next visit.  - Likely related to atopic hx with asthma and allergic rhinitis.  Could also be sometimes seen with TNF-a blockade use.   Parotid Gland Swelling - Intermittent swelling, could be related to illness or blocked ducts. - Can do warm massage or sour candy.  - If persistent or frequent, will refer to ENT for further evaluation.

## 2023-05-02 ENCOUNTER — Other Ambulatory Visit (HOSPITAL_COMMUNITY): Payer: Self-pay

## 2023-05-03 ENCOUNTER — Other Ambulatory Visit (HOSPITAL_COMMUNITY): Payer: Self-pay

## 2023-05-07 ENCOUNTER — Other Ambulatory Visit: Payer: Self-pay

## 2023-05-07 ENCOUNTER — Other Ambulatory Visit: Payer: Self-pay | Admitting: Pharmacist

## 2023-05-07 MED ORDER — ENBREL SURECLICK 50 MG/ML ~~LOC~~ SOAJ
SUBCUTANEOUS | 0 refills | Status: DC
  Filled 2023-05-07: qty 4, 28d supply, fill #0

## 2023-05-07 MED ORDER — ENBREL SURECLICK 50 MG/ML ~~LOC~~ SOAJ: SUBCUTANEOUS | 0 refills | Status: DC

## 2023-05-07 NOTE — Progress Notes (Signed)
Specialty Pharmacy Refill Coordination Note  Jamie Melton is a 52 y.o. female contacted today regarding refills of specialty medication(s) Etanercept   Patient requested Delivery   Delivery date: 05/11/23   Verified address: [address] [place] [address]   Medication will be filled on 05/10/23. Refill request is pending.

## 2023-05-09 ENCOUNTER — Telehealth: Payer: Self-pay

## 2023-05-09 NOTE — Telephone Encounter (Signed)
Received fax from [facility] - DOB verified - previous skin testing.  Documents have been placed in provider's in basket for review.  Forwarding message to provider as update.

## 2023-05-11 ENCOUNTER — Other Ambulatory Visit: Payer: Self-pay | Admitting: Internal Medicine

## 2023-05-11 NOTE — Progress Notes (Signed)
Results from [facility] show positive SPT to trees, grasses, weeds, molds, dust mites, cockroach, cats.

## 2023-05-23 LAB — HELIX MOLECULAR SCREEN: Genetic Analysis Overall Interpretation: NEGATIVE

## 2023-05-27 ENCOUNTER — Other Ambulatory Visit (HOSPITAL_COMMUNITY): Payer: Self-pay

## 2023-05-29 ENCOUNTER — Other Ambulatory Visit (HOSPITAL_COMMUNITY): Payer: Self-pay

## 2023-05-31 ENCOUNTER — Other Ambulatory Visit (HOSPITAL_COMMUNITY): Payer: Self-pay

## 2023-06-01 ENCOUNTER — Other Ambulatory Visit: Payer: Self-pay

## 2023-06-01 ENCOUNTER — Other Ambulatory Visit (HOSPITAL_COMMUNITY): Payer: Self-pay

## 2023-06-01 ENCOUNTER — Encounter (HOSPITAL_COMMUNITY): Payer: Self-pay

## 2023-06-01 NOTE — Progress Notes (Signed)
Specialty Pharmacy Refill Coordination Note  Jamie Melton is a 52 y.o. female contacted today regarding refills of specialty medication(s) Etanercept   Patient requested Delivery   Delivery date: 06/07/23   Verified address: [address] [place] [address]   Medication will be filled on 06/06/23.  Refill request pending. Rewrite required.

## 2023-06-04 ENCOUNTER — Other Ambulatory Visit: Payer: Self-pay

## 2023-06-04 ENCOUNTER — Other Ambulatory Visit (HOSPITAL_COMMUNITY): Payer: Self-pay

## 2023-06-04 ENCOUNTER — Ambulatory Visit: Payer: 59 | Attending: Family | Admitting: Pharmacist

## 2023-06-04 DIAGNOSIS — Z79899 Other long term (current) drug therapy: Secondary | ICD-10-CM

## 2023-06-04 MED ORDER — ENBREL SURECLICK 50 MG/ML ~~LOC~~ SOAJ: SUBCUTANEOUS | 0 refills | Status: DC

## 2023-06-04 MED ORDER — ENBREL SURECLICK 50 MG/ML ~~LOC~~ SOAJ
50.0000 mg | SUBCUTANEOUS | 0 refills | Status: DC
  Filled 2023-06-04: qty 4, 28d supply, fill #0

## 2023-06-04 NOTE — Progress Notes (Signed)
   S: Patient presents for review of their specialty medication therapy.  Patient is currently taking Enbrel for ankylosing spondylitis. Patient is managed by Dr. Deanne Coffer for this.   Adherence: confirms  Efficacy: reports that it is working well for her   Dosing: 50mg  subq once weekly   Ankylosing spondylitis, psoriatic arthritis, rheumatoid arthritis: SubQ: Note: May continue methotrexate, glucocorticoids, salicylates, NSAIDs, or analgesics during etanercept therapy. Once-weekly dosing: 50 mg once weekly; maximum dose (rheumatoid arthritis): 50 mg/week. Twice-weekly dosing (off-label dose): 25 mg twice weekly Doyce Loose 2000; Calin 2004; Earlene Plater 2003; [place] Charon 2002; [place] 2000; [place] 2002)  Screening: TB test: completed  Hepatitis B: completed   Monitoring: Injection site reactions: none S/sx of infections: none S/sx of malignancy: none GI upset: none  O: Lab Results  Component Value Date   WBC 6.3 03/23/2023   HGB 12.3 03/23/2023   HCT 37.2 03/23/2023   MCV 97.1 03/23/2023   PLT 196.0 03/23/2023      Chemistry      Component Value Date/Time   NA 139 03/16/2023 1109   K 4.2 03/16/2023 1109   CL 106 03/16/2023 1109   CO2 28 03/16/2023 1109   BUN 13 03/16/2023 1109   CREATININE 0.90 03/16/2023 1109   CREATININE 0.76 05/21/2019 1443      Component Value Date/Time   CALCIUM 9.2 03/16/2023 1109   ALKPHOS 66 03/16/2023 1109   AST 17 03/16/2023 1109   ALT 18 03/16/2023 1109   BILITOT 0.3 03/16/2023 1109       A/P: 1. Medication review: patient currently taking Enbrel for ankylosing spondylitis. Reviewed the medication with the patient, including the following: Enbrel (etanercept) binds tumor necrosis factor (TNF) and blocks its interaction with cell surface receptors. TNF plays an important role in the inflammatory processes of many diseases. Patient educated on purpose, proper use and potential adverse effects of Enbrel. SubQ: Administer subcutaneously. Rotate injection  sites; may inject into the thigh (preferred), abdomen (avoiding the 2-inch area around the navel), or outer areas of upper arm. New injections should be given at least 1 inch from an old site and never into areas where the skin is tender, bruised, red, or hard; any raised thick, red, or scaly skin patches or lesions; or areas with scars or stretch marks. For a more comfortable injection, allow autoinjectors, prefilled syringes, and dose trays to reach room temperature for 15 to 30 minutes (>=30 minutes for autoinjector) prior to injection; do not remove the needle cover while allowing product to reach room temperature. There may be small white particles of protein in the solution; this is not unusual for proteinaceous solutions. Possible adverse effects include rash, GI upset, increased risk of infection, and injection site reactions. Patients should stop Enbrel if they develop a serious infection. There is a possible increased risk in lymphoma and other malignancies. No recommendations for any changes.   Butch Penny, PharmD, Patsy [place], CPP Clinical Pharmacist [facility] [phone]

## 2023-06-05 ENCOUNTER — Other Ambulatory Visit: Payer: Self-pay

## 2023-06-06 ENCOUNTER — Other Ambulatory Visit: Payer: Self-pay

## 2023-06-11 IMAGING — US US ABDOMEN COMPLETE
1 series · 14 of 25 positions shown · non-contrast
Comparison: None.

CLINICAL DATA: Epigastric and left upper quadrant abdominal pain.

EXAM:
ABDOMEN ULTRASOUND COMPLETE

[Series 1: us abdomen complete · 0.19mm/px · 14 of 88 slices shown]
[im 1/88]
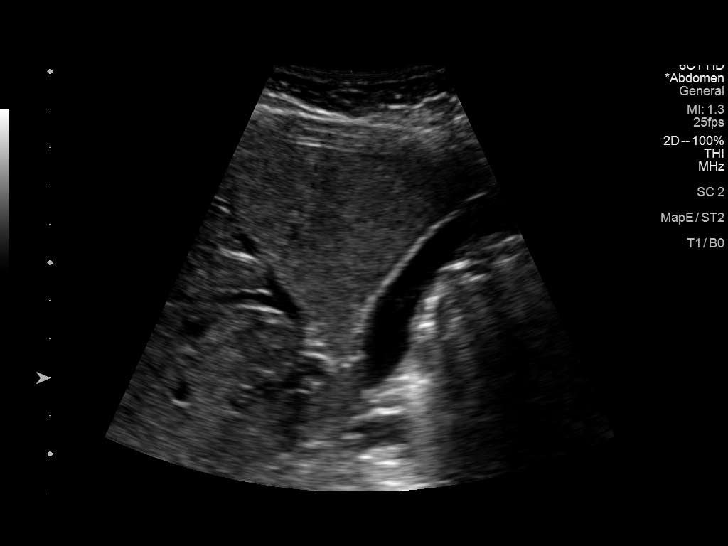
[im 8/88]
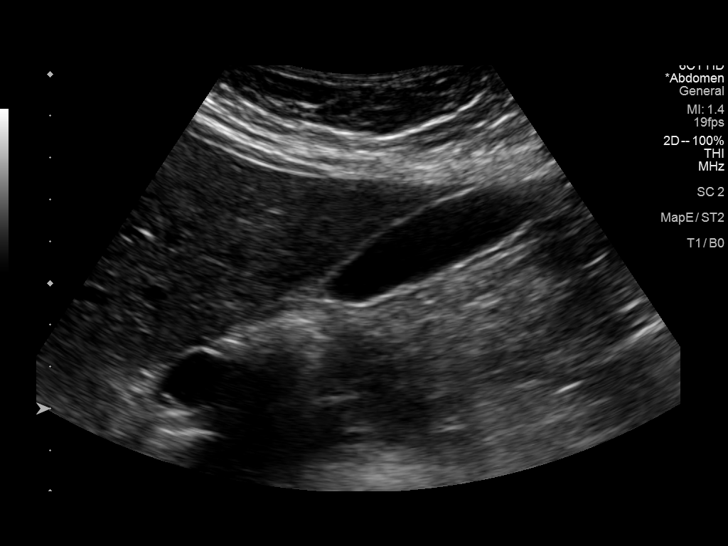
[im 15/88]
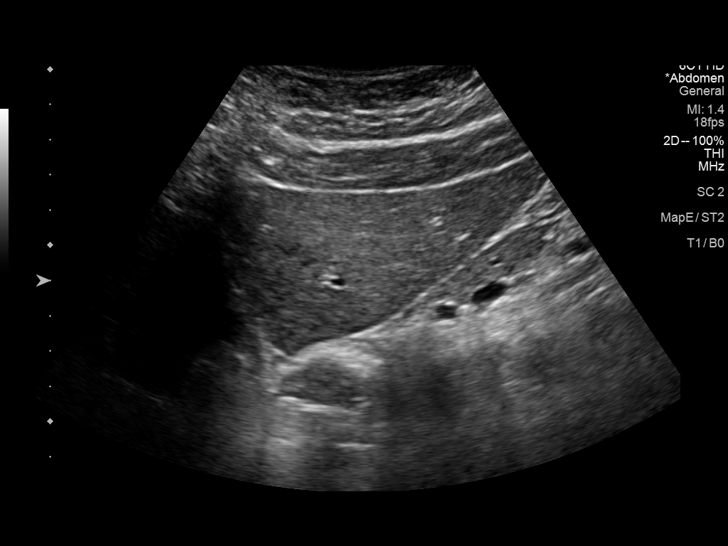
[im 22/88]
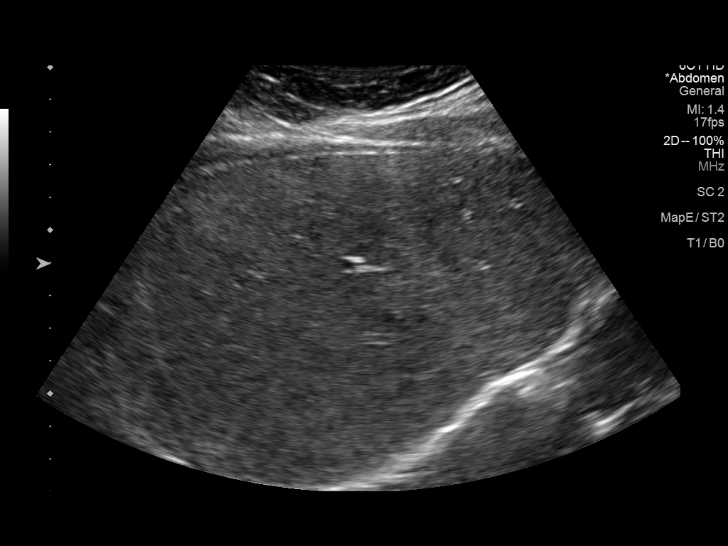
[im 30/88]
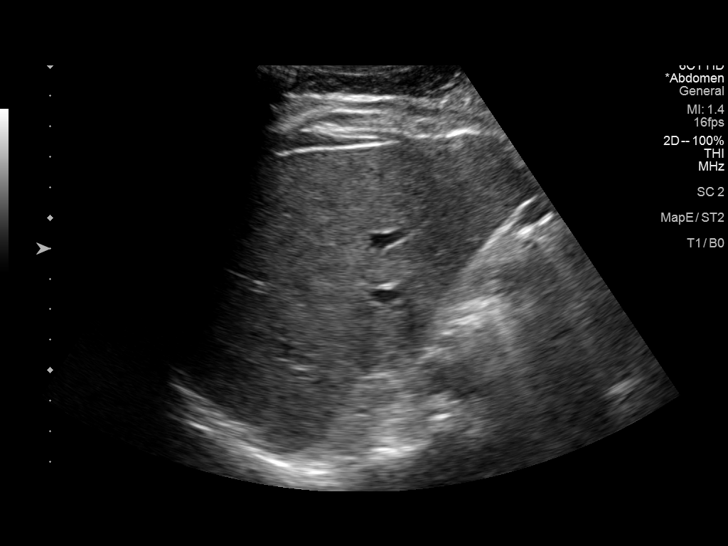
[im 33/88]
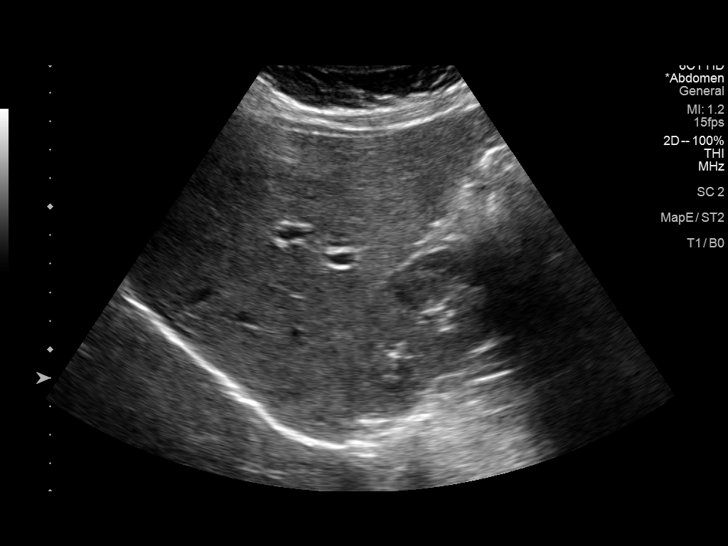
[im 40/88]
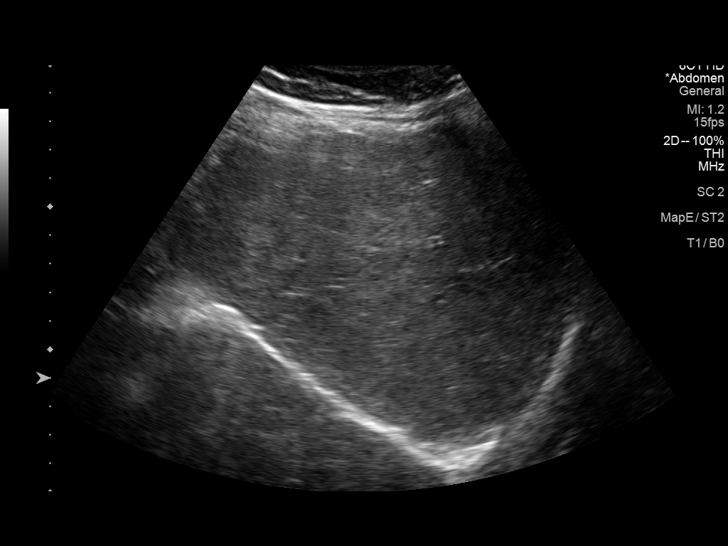
[im 48/88]
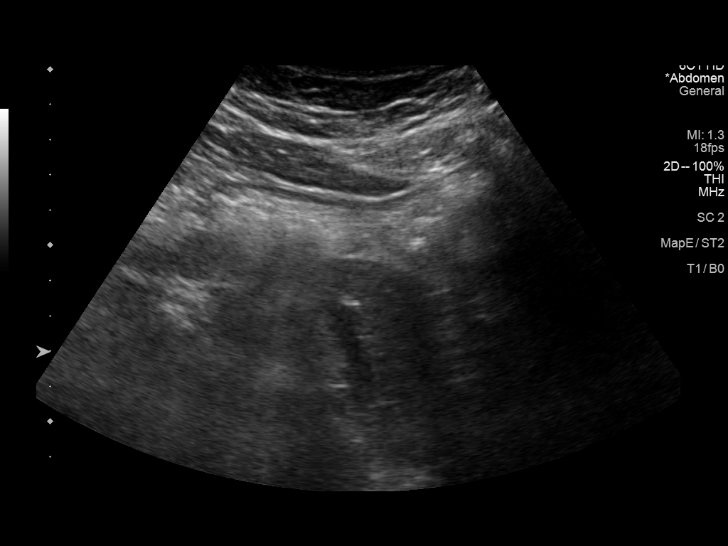
[im 55/88]
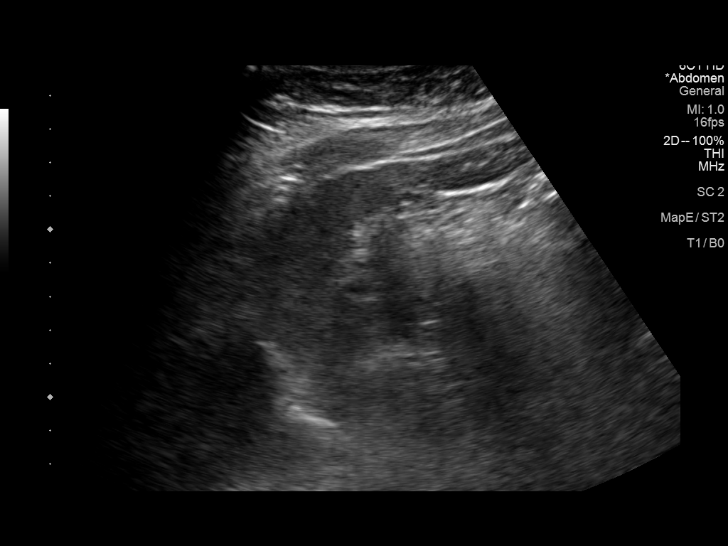
[im 59/88]
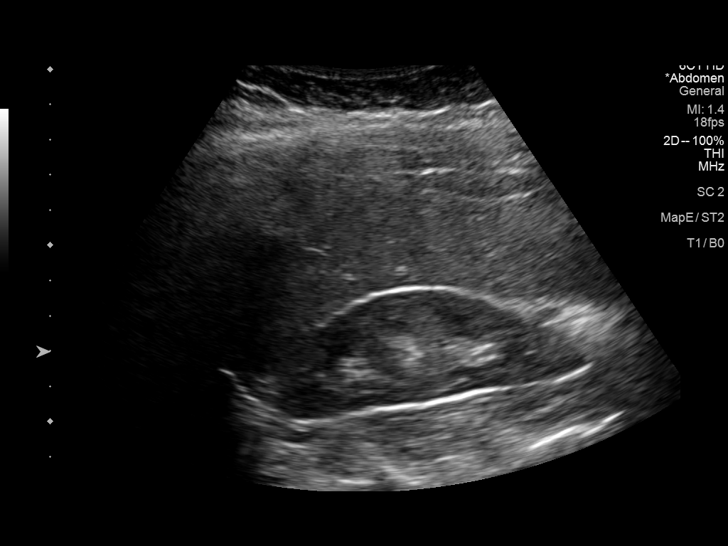
[im 66/88]
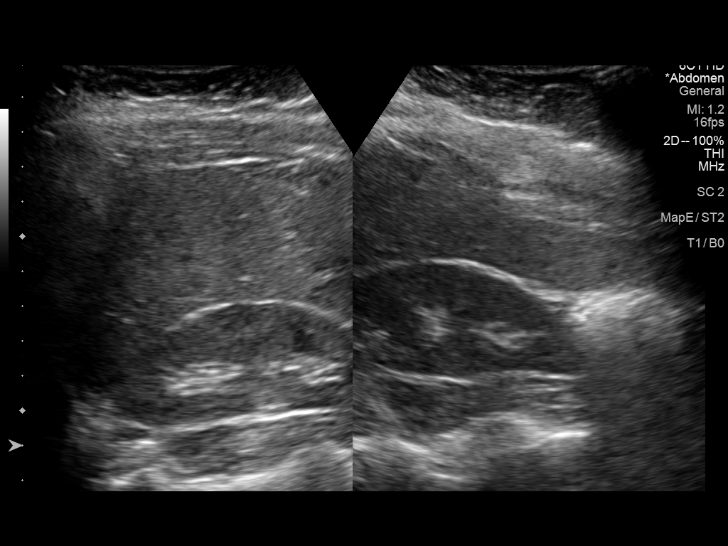
[im 73/88]
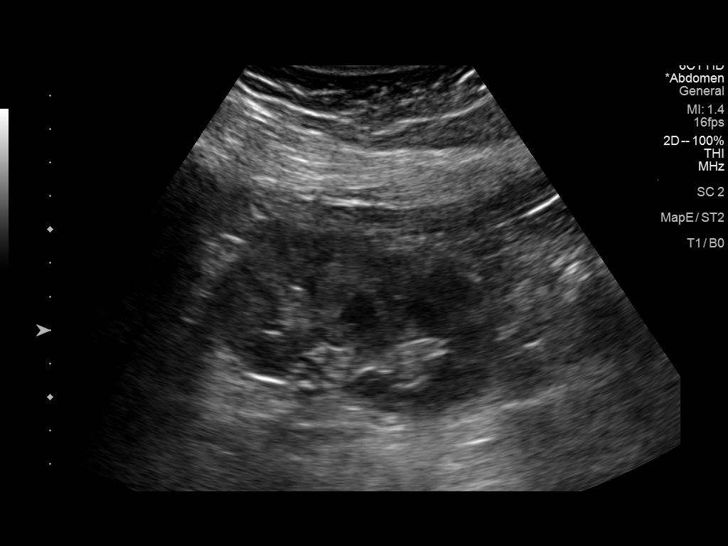
[im 80/88]
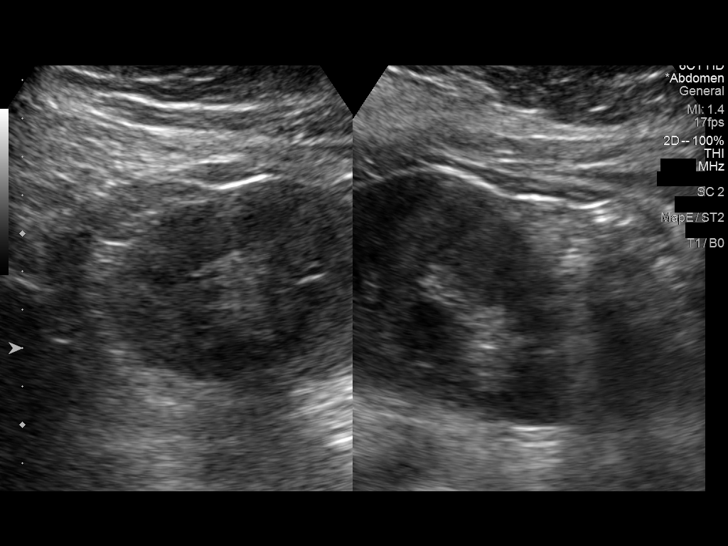
[im 88/88]
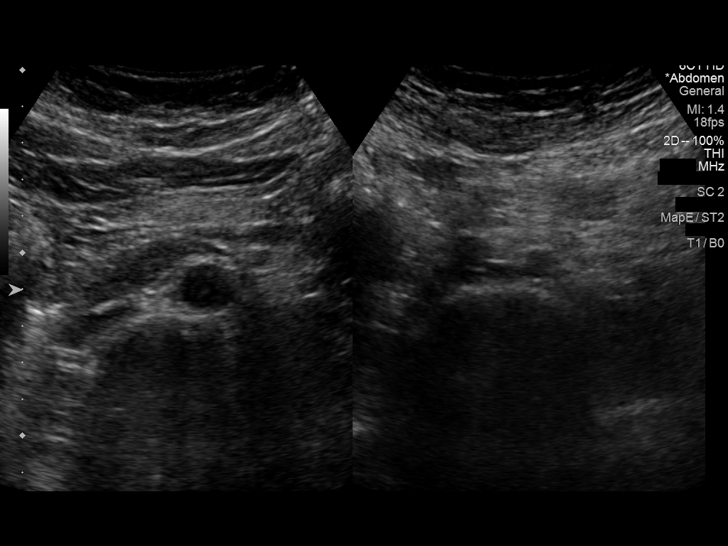

[14 of 25 positions shown; findings below may reference images not displayed]

FINDINGS: Gallbladder: No gallstones or wall thickening visualized. No
sonographic Murphy sign noted by sonographer.

Common bile duct: Diameter: Normal, 2 mm.

Liver: No focal lesion identified. Within normal limits in
parenchymal echogenicity. Portal vein is patent on color Doppler
imaging with normal direction of blood flow towards the liver.

IVC: No abnormality visualized.

Pancreas: Visualized portion unremarkable.

Spleen: Size and appearance within normal limits.

Right Kidney: Length: 9.4 cm. Echogenicity within normal limits. No
mass or hydronephrosis visualized.

Left Kidney: Length: 9.3 cm. Echogenicity within normal limits. No
mass or hydronephrosis visualized.

Abdominal aorta: No aneurysm visualized.

Other findings: No ascites.
IMPRESSION: No acute process or explanation for patient's symptoms.

## 2023-06-27 ENCOUNTER — Other Ambulatory Visit: Payer: Self-pay

## 2023-06-27 ENCOUNTER — Other Ambulatory Visit: Payer: Self-pay | Admitting: Pharmacist

## 2023-06-27 ENCOUNTER — Encounter (HOSPITAL_COMMUNITY): Payer: Self-pay

## 2023-06-27 MED ORDER — ENBREL SURECLICK 50 MG/ML ~~LOC~~ SOAJ: SUBCUTANEOUS | 0 refills | Status: DC

## 2023-06-27 MED ORDER — ENBREL SURECLICK 50 MG/ML ~~LOC~~ SOAJ
SUBCUTANEOUS | 0 refills | Status: DC
  Filled 2023-06-28: qty 4, 28d supply, fill #0

## 2023-06-27 NOTE — Progress Notes (Signed)
Specialty Pharmacy Refill Coordination Note  Jamie Melton is a 52 y.o. female contacted today regarding refills of specialty medication(s) Etanercept   Patient requested Delivery   Delivery date: 07/05/23   Verified address: [address] [place] [address]   Medication will be filled on 07/04/23.  Refill request pending - rewrite required.

## 2023-06-27 NOTE — Progress Notes (Signed)
Please see OV 06/04/23 for full documentation.   Butch Penny, PharmD, Patsy [place], CPP Clinical Pharmacist [facility] [phone]

## 2023-06-28 ENCOUNTER — Other Ambulatory Visit: Payer: Self-pay

## 2023-06-28 ENCOUNTER — Other Ambulatory Visit (HOSPITAL_COMMUNITY): Payer: Self-pay

## 2023-06-29 ENCOUNTER — Other Ambulatory Visit: Payer: Self-pay | Admitting: Internal Medicine

## 2023-06-29 ENCOUNTER — Other Ambulatory Visit: Payer: Self-pay

## 2023-06-29 ENCOUNTER — Other Ambulatory Visit (HOSPITAL_COMMUNITY): Payer: Self-pay

## 2023-06-29 MED ORDER — ALBUTEROL SULFATE HFA 108 (90 BASE) MCG/ACT IN AERS
2.0000 | INHALATION_SPRAY | Freq: Four times a day (QID) | RESPIRATORY_TRACT | 1 refills | Status: DC | PRN
  Filled 2023-06-29: qty 6.7, 25d supply, fill #0
  Filled 2023-08-20: qty 6.7, 25d supply, fill #1

## 2023-07-04 ENCOUNTER — Other Ambulatory Visit: Payer: Self-pay

## 2023-07-27 ENCOUNTER — Other Ambulatory Visit: Payer: Self-pay

## 2023-07-27 NOTE — Progress Notes (Signed)
 Specialty Pharmacy Refill Coordination Note  Jamie Melton is a 53 y.o. female contacted today regarding refills of specialty medication(s) Etanercept  (Enbrel  SureClick)   Patient requested Delivery   Delivery date: 08/10/23   Verified address: [address]  [place] [address]   Medication will be filled on 08/09/23 pending a refill request a rewrite.

## 2023-07-30 ENCOUNTER — Other Ambulatory Visit (HOSPITAL_COMMUNITY): Payer: Self-pay

## 2023-07-30 ENCOUNTER — Other Ambulatory Visit: Payer: Self-pay

## 2023-07-30 ENCOUNTER — Other Ambulatory Visit: Payer: Self-pay | Admitting: Pharmacist

## 2023-07-30 MED ORDER — ENBREL SURECLICK 50 MG/ML ~~LOC~~ SOAJ: SUBCUTANEOUS | 0 refills | Status: DC

## 2023-07-30 MED ORDER — ENBREL SURECLICK 50 MG/ML ~~LOC~~ SOAJ
SUBCUTANEOUS | 0 refills | Status: DC
  Filled 2023-07-30: qty 4, 28d supply, fill #0

## 2023-07-30 MED ORDER — ONDANSETRON HCL 4 MG PO TABS
4.0000 mg | ORAL_TABLET | Freq: Three times a day (TID) | ORAL | 0 refills | Status: DC | PRN
  Filled 2023-07-30: qty 30, 10d supply, fill #0

## 2023-07-31 ENCOUNTER — Other Ambulatory Visit (HOSPITAL_COMMUNITY): Payer: Self-pay

## 2023-08-17 ENCOUNTER — Ambulatory Visit: Payer: Self-pay | Admitting: Family

## 2023-08-17 NOTE — Telephone Encounter (Signed)
Chief Complaint: Rib cage pain  Symptoms: Left side rib cage pain, upper abdominal pain Frequency: Comes and goes  Pertinent Negatives: Patient denies Sharp chest pain, nausea, vomiting, sweats, fever Disposition: [] ED /[] Urgent Care (no appt availability in office) / [x] Appointment(In office/virtual)/ []  [facility]/ [] Home Care/ [] Refused Recommended Disposition /[] [facility]/ []  Follow-up with PCP Additional Notes: Patient states she has had left side rib cage and upper abdominal pain for over 3 months now. Patient stated she had this problem in the past and was referred to GI but nothing was found. Patient reports it went away for a long time but now it is back reporting the discomfort a 4/10. Care advice was given and patient has been scheduled for an appointment with PCP on 08/20/23.  Copied from CRM [id number]. Topic: Clinical - Red Word Triage >> Aug 17, 2023 10:42 AM Larwance Sachs wrote: Red Word that prompted transfer to Nurse Triage: Patient called in regarding getting a sick visit schedule, only symptoms is pain on left side rib cage area Reason for Disposition  [1] Patient says chest pain feels exactly the same as previously diagnosed "heartburn" AND [2] describes burning in chest AND [3] accompanying sour taste in mouth  Answer Assessment - Initial Assessment Questions 1. LOCATION: "Where does it hurt?"       Left side pain near ribs  2. RADIATION: "Does the pain go anywhere else?" (e.g., into neck, jaw, arms, back)     To the abdominal area  3. ONSET: "When did the chest pain begin?" (Minutes, hours or days)      Over 3 months ago  4. PATTERN: "Does the pain come and go, or has it been constant since it started?"  "Does it get worse with exertion?"      Comes and goes  5. DURATION: "How long does it last" (e.g., seconds, minutes, hours)     It varies  6. SEVERITY: "How bad is the pain?"  (e.g., Scale 1-10; mild, moderate, or severe)    - MILD (1-3): doesn't  interfere with normal activities     - MODERATE (4-7): interferes with normal activities or awakens from sleep    - SEVERE (8-10): excruciating pain, unable to do any normal activities       4/10 7. CARDIAC RISK FACTORS: "Do you have any history of heart problems or risk factors for heart disease?" (e.g., angina, prior heart attack; diabetes, high blood pressure, high cholesterol, smoker, or strong family history of heart disease)     No  8. PULMONARY RISK FACTORS: "Do you have any history of lung disease?"  (e.g., blood clots in lung, asthma, emphysema, birth control pills)     Asthma 9. CAUSE: "What do you think is causing the chest pain?"     I'm not sure  10. OTHER SYMPTOMS: "Do you have any other symptoms?" (e.g., dizziness, nausea, vomiting, sweating, fever, difficulty breathing, cough)       No  Protocols used: Chest Pain-A-AH

## 2023-08-20 ENCOUNTER — Ambulatory Visit: Payer: 59 | Admitting: Family

## 2023-08-20 ENCOUNTER — Other Ambulatory Visit (HOSPITAL_COMMUNITY): Payer: Self-pay

## 2023-08-20 ENCOUNTER — Encounter: Payer: Self-pay | Admitting: Family

## 2023-08-20 VITALS — BP 104/70 | HR 75 | Temp 98.0°F | Ht 60.0 in | Wt 154.0 lb

## 2023-08-20 DIAGNOSIS — K219 Gastro-esophageal reflux disease without esophagitis: Secondary | ICD-10-CM | POA: Insufficient documentation

## 2023-08-20 MED ORDER — PANTOPRAZOLE SODIUM 20 MG PO TBEC
20.0000 mg | DELAYED_RELEASE_TABLET | Freq: Every day | ORAL | 2 refills | Status: DC
  Filled 2023-08-20: qty 45, 30d supply, fill #0

## 2023-08-20 NOTE — Progress Notes (Signed)
Patient ID: Jamie Melton, female    DOB: 10/30/1970, 53 y.o.   MRN: [id number]  Chief Complaint  Patient presents with   Abdominal Pain    Pt c/o abdominal pain on left side around to back. Has tried tums which did help slightly.        Discussed the use of AI scribe software for clinical note transcription with the patient, who gave verbal consent to proceed.  History of Present Illness   The patient presents with abdominal pain, described as being located on one side and radiating to the back, 'like inside my ribcage.' She reports a sensation of indigestion and heartburn, which she has been managing with Tums. She has previously been prescribed Protonix for similar symptoms, but is not currently taking it. The patient also reports a sensation of irritation when swallowing, suggesting possible acid reflux. She has not made any recent changes to her diet, but does consume citrus fruits and caffeine. The patient denies any increase in stress levels, but does have a level of stress she deals with daily.     Assessment & Plan:     Gastroesophageal Reflux Disease (GERD) - Patient reports epigastric pain radiating to the back, heartburn, and throat irritation. Previously managed with Protonix 40mg , but currently not taking it. Patient has been using Tums for symptom relief. -Resume Protonix at 20mg  twice daily for 1-2 weeks, then reduce to once daily or every other day for another 1-2 weeks until sx improved. -Advised patient to reduce intake of citrus fruits and caffeine. -If symptoms persist call/message and let me know.      Subjective:    Outpatient Medications Prior to Visit  Medication Sig Dispense Refill   albuterol (VENTOLIN HFA) 108 (90 Base) MCG/ACT inhaler Inhale 2 puffs into the lungs every 6 (six) hours as needed. 6.7 g 1   ammonium lactate (LAC-HYDRIN) 12 % lotion Apply 1 Application topically as needed for dry skin. 400 g 5   budesonide-formoterol (SYMBICORT) 80-4.5 MCG/ACT  inhaler Inhale 2 puffs into the lungs 2 (two) times daily. 10.2 g 5   Calcium Carb-Cholecalciferol (LIQUID CALCIUM WITH D3) 600-12.5 MG-MCG CAPS      chlorhexidine (PERIDEX) 0.12 % solution Rinse with 1/2 ounce for 30 seconds twice a day 473 mL 1   diphenhydrAMINE HCl (BENADRYL ALLERGY PO) Take by mouth as needed.     etanercept (ENBREL SURECLICK) 50 MG/ML injection as directed inject 50 mg subcutaneously every week 4 mL 0   fluticasone (FLONASE) 50 MCG/ACT nasal spray Place 2 sprays into both nostrils daily. 16 g 6   levocetirizine (XYZAL) 5 MG tablet Take 1 tablet (5 mg total) by mouth every evening. 30 tablet 5   Multiple Vitamin (MULTIVITAMIN WITH MINERALS) TABS tablet Take 1 tablet by mouth daily.     Olopatadine HCl 0.2 % SOLN Place 1 drop into affected eye(s) once a day or  as needed 2.5 mL 5   ondansetron (ZOFRAN) 4 MG tablet Take 1 tablet (4 mg total) by mouth every 8 (eight) hours as needed for nausea or vomiting. 30 tablet 0   polyethylene glycol-electrolytes (NULYTELY) 420 g solution Use as directed 4000 mL 0   tretinoin (RETIN-A) 0.025 % cream Apply 1 application topically every evening to face. 45 g 3   No facility-administered medications prior to visit.   Past Medical History:  Diagnosis Date   Acute non-recurrent frontal sinusitis 08/30/2021   Asthma    DR KOSLOW   Asthma 08/02/2011  Chest pain 08/02/2011   Fibroid 2012   Fibromyalgia 02/15/2022   GERD (gastroesophageal reflux disease)    Headache(784.0)    FREQUENT   Infection    UTI   Pain in joint, multiple sites    Palpable abd. aorta 08/02/2011   PROM (premature rupture of membranes) at term 03/29/2013   Spondylosis    Thrombocytopenia, unspecified (HCC) 03/29/2013   Platelets 88 on admission. 140 at 28 weeks 218 at NOB.   Unspecified abortion, complicated by metabolic disorder, unspecified    Vaginal delivery 04/01/2013   Past Surgical History:  Procedure Laterality Date   ABDOMINOPLASTY  07/10/2022   APPENDECTOMY      BILATERAL SALPINGECTOMY Bilateral 03/30/2013   Procedure: BILATERAL SALPINGECTOMY;  Surgeon: Hal Morales, MD;  Location: [facility];  Service: Obstetrics;  Laterality: Bilateral;   CESAREAN SECTION N/A 03/30/2013   Procedure: Primary CESAREAN SECTION of baby  boy at 0521 APGAR 8/9;  Surgeon: Hal Morales, MD;  Location: [facility];  Service: Obstetrics;  Laterality: N/A;   NASAL SINUS SURGERY     No Known Allergies    Objective:    Physical Exam Vitals and nursing note reviewed.  Constitutional:      Appearance: Normal appearance.  Cardiovascular:     Rate and Rhythm: Normal rate and regular rhythm.  Pulmonary:     Effort: Pulmonary effort is normal.     Breath sounds: Normal breath sounds.  Musculoskeletal:        General: Normal range of motion.  Skin:    General: Skin is warm and dry.  Neurological:     Mental Status: She is alert.  Psychiatric:        Mood and Affect: Mood normal.        Behavior: Behavior normal.    BP 104/70 (BP Location: Left Arm, Patient Position: Sitting)   Pulse 75   Temp 98 F (36.7 C) (Temporal)   Ht 5' (1.524 m)   Wt 154 lb (69.9 kg)   SpO2 97%   BMI 30.08 kg/m  Wt Readings from Last 3 Encounters:  08/20/23 154 lb (69.9 kg)  05/01/23 151 lb (68.5 kg)  03/16/23 152 lb 3.2 oz (69 kg)       Dulce Sellar, NP

## 2023-08-24 ENCOUNTER — Other Ambulatory Visit: Payer: Self-pay

## 2023-08-29 ENCOUNTER — Other Ambulatory Visit: Payer: Self-pay

## 2023-08-31 ENCOUNTER — Other Ambulatory Visit (HOSPITAL_COMMUNITY): Payer: Self-pay

## 2023-09-04 ENCOUNTER — Other Ambulatory Visit (HOSPITAL_COMMUNITY): Payer: Self-pay

## 2023-09-04 ENCOUNTER — Ambulatory Visit (INDEPENDENT_AMBULATORY_CARE_PROVIDER_SITE_OTHER): Payer: 59 | Admitting: Internal Medicine

## 2023-09-04 ENCOUNTER — Other Ambulatory Visit: Payer: Self-pay

## 2023-09-04 ENCOUNTER — Other Ambulatory Visit: Payer: Self-pay | Admitting: Pharmacist

## 2023-09-04 ENCOUNTER — Encounter: Payer: Self-pay | Admitting: Internal Medicine

## 2023-09-04 VITALS — BP 118/76 | HR 69 | Temp 97.4°F | Resp 18

## 2023-09-04 DIAGNOSIS — J4540 Moderate persistent asthma, uncomplicated: Secondary | ICD-10-CM | POA: Diagnosis not present

## 2023-09-04 DIAGNOSIS — J3089 Other allergic rhinitis: Secondary | ICD-10-CM | POA: Diagnosis not present

## 2023-09-04 DIAGNOSIS — D7219 Other eosinophilia: Secondary | ICD-10-CM

## 2023-09-04 DIAGNOSIS — J302 Other seasonal allergic rhinitis: Secondary | ICD-10-CM

## 2023-09-04 MED ORDER — LEVOCETIRIZINE DIHYDROCHLORIDE 5 MG PO TABS
5.0000 mg | ORAL_TABLET | Freq: Every evening | ORAL | 5 refills | Status: DC
  Filled 2023-09-04 – 2023-09-24 (×2): qty 30, 30d supply, fill #0
  Filled 2023-11-23: qty 30, 30d supply, fill #1
  Filled 2024-01-16: qty 30, 30d supply, fill #2
  Filled 2024-03-12: qty 30, 30d supply, fill #3
  Filled 2024-05-08: qty 30, 30d supply, fill #4
  Filled 2024-06-07: qty 30, 30d supply, fill #5

## 2023-09-04 MED ORDER — ALBUTEROL SULFATE HFA 108 (90 BASE) MCG/ACT IN AERS
2.0000 | INHALATION_SPRAY | Freq: Four times a day (QID) | RESPIRATORY_TRACT | 1 refills | Status: DC | PRN
  Filled 2023-09-04 – 2024-01-16 (×2): qty 6.7, 25d supply, fill #0
  Filled 2024-03-12: qty 6.7, 25d supply, fill #1

## 2023-09-04 MED ORDER — FLUTICASONE PROPIONATE 50 MCG/ACT NA SUSP
2.0000 | Freq: Every day | NASAL | 6 refills | Status: AC
  Filled 2023-09-04 – 2023-09-24 (×2): qty 16, 30d supply, fill #0
  Filled 2024-01-16: qty 16, 30d supply, fill #1
  Filled 2024-04-07: qty 16, 30d supply, fill #2

## 2023-09-04 MED ORDER — ENBREL SURECLICK 50 MG/ML ~~LOC~~ SOAJ: 50.0000 mg | SUBCUTANEOUS | 0 refills | Status: DC

## 2023-09-04 MED ORDER — BUDESONIDE-FORMOTEROL FUMARATE 80-4.5 MCG/ACT IN AERO
2.0000 | INHALATION_SPRAY | Freq: Two times a day (BID) | RESPIRATORY_TRACT | 5 refills | Status: AC
  Filled 2023-09-04 – 2023-09-24 (×2): qty 10.2, 30d supply, fill #0
  Filled 2024-01-16: qty 10.2, 30d supply, fill #1
  Filled 2024-04-07: qty 10.2, 30d supply, fill #2
  Filled 2024-08-04: qty 10.2, 30d supply, fill #0
  Filled 2024-08-04: qty 10.2, 30d supply, fill #3

## 2023-09-04 MED ORDER — ENBREL SURECLICK 50 MG/ML ~~LOC~~ SOAJ
50.0000 mg | SUBCUTANEOUS | 0 refills | Status: DC
  Filled 2023-09-05: qty 4, 28d supply, fill #0

## 2023-09-04 NOTE — Patient Instructions (Addendum)
Moderate Persistent Asthma: - Maintenance inhaler: continue Symbicort 80-4.16mcg 2 puffs daily - With flare ups or respiratory illness, use Symbicort 80-4.72mcg 2 puffs twice daily for 1-2 weeks.  - Rescue inhaler: Albuterol 2 puffs via spacer or 1 vial via nebulizer every 4-6 hours as needed for respiratory symptoms of cough, shortness of breath, or wheezing Asthma control goals:  Full participation in all desired activities (may need albuterol before activity) Albuterol use two times or less a week on average (not counting use with activity) Cough interfering with sleep two times or less a month Oral steroids no more than once a year No hospitalizations  Allergic Rhinitis: - SPT 11/2023 with [facility] Allergy: positive to trees, grasses, weeds, molds, dust mites, cockroach, cat  - Use nasal saline rinses before nose sprays such as with Neilmed Sinus Rinse.  Use distilled water.   - Use Flonase 2 sprays each nostril daily. Aim upward and outward. - Use Xyzal 5mg  daily. Okay to take a second dose if needed.  - Consider allergy shots as long term control of your symptoms by teaching your immune system to be more tolerant of your allergy triggers.   Eosinophilia - Will repeat CBC with diff today.  - Likely related to atopic hx with asthma and allergic rhinitis.

## 2023-09-04 NOTE — Progress Notes (Signed)
FOLLOW UP Date of Service/Encounter:  09/04/23   Subjective:  Jamie Melton (DOB: Oct 04, 1970) is a 53 y.o. female who [facility] for follow up for moderate persistent asthma, allergic rhinitis, eosinophilia.  History obtained from: chart review and patient. Last visit was with me on May 01, 2023 and at the time we discussed continuation of Symbicort, Flonase, Xyzal.  Also has absolute eosinophil count of around 700-900; discussed watching it for now as it was likely due to her atopic history with asthma and allergies with plans to recheck in at next visit.  Does report travel to [place] and has never had any testing for parasitic infections and denies any chronic diarrhea or bloody stools.  Since last visit, reports getting sick a few weeks ago and had to take her Symbicort twice daily instead of of daily and needed her rescue albuterol a few times.  Did not have to go to her PCP or urgent care or ER.  Did not require oral prednisone.  She is feeling a lot better.  Now using Symbicort daily and has not really needed the albuterol. Allergies are doing really well.  Not much congestion, drainage, runny nose.  Symptoms are usually worse during spring and fall.  Using Flonase and Xyzal daily. No recent parotid gland swelling; does report it can happen on either side and comes and goes; resolves with massage.   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 140 at 28 weeks 218 at  NO[facility].   Unspecified abortion, complicated by metabolic disorder, unspecified    Vaginal delivery 04/01/2013    Objective:  BP 118/76 (BP Location: Right Arm, Patient Position: Sitting, Cuff Size: Normal)   Pulse 69   Temp (!) 97.4 F (36.3 C) (Temporal)   Resp 18   SpO2 97%  There is no height or weight on file to calculate BMI. Physical Exam: GEN: alert, well developed HEENT: clear conjunctiva, nose with mild inferior turbinate hypertrophy, pink nasal mucosa, clear rhinorrhea, + cobblestoning HEART: regular rate and rhythm, no murmur LUNGS: clear to auscultation bilaterally, no coughing, unlabored respiration SKIN: no rashes or lesions  Spirometry:  Tracings reviewed. Her effort: Good reproducible efforts. FVC: 2.66L, 105% predicted  FEV1: 1.68L, 82% predicted FEV1/FVC ratio: 63% Interpretation: Spirometry consistent with mild obstructive disease.  Please see scanned spirometry results for details.  Assessment:   1. Seasonal and perennial allergic rhinitis   2. Other eosinophilia   3. Moderate persistent asthma without complication     Plan/Recommendations:  Moderate Persistent Asthma: - Spirometry with some obstruction but symptomatically well controlled.  - Maintenance inhaler: continue Symbicort 80-4.52mcg 2 puffs daily - With flare ups or respiratory illness, use Symbicort 80-4.60mcg 2 puffs twice daily for 1-2 weeks.  - Rescue inhaler: Albuterol 2 puffs via spacer or 1 vial via nebulizer every 4-6 hours as needed for respiratory symptoms of cough, shortness of breath, or wheezing Asthma  control goals:  Full participation in all desired activities (may need albuterol before activity) Albuterol use two times or less a week on average (not counting use with activity) Cough interfering with sleep two times or less a month Oral steroids no more than once a year No hospitalizations  Allergic Rhinitis: - SPT 11/2023 with [facility] Allergy: positive to trees, grasses, weeds,  molds, dust mites, cockroach, cat  - Use nasal saline rinses before nose sprays such as with Neilmed Sinus Rinse.  Use distilled water.   - Use Flonase 2 sprays each nostril daily. Aim upward and outward. - Use Xyzal 5mg  daily. Okay to take a second dose if needed.  - Consider allergy shots as long term control of your symptoms by teaching your immune system to be more tolerant of your allergy triggers.   Eosinophilia - Will repeat CBC with diff today.  - 02/2024: AEC 900; previously around 700  - Likely related to atopic hx with asthma and allergic rhinitis.  Could also be sometimes seen with TNF-a blockade use.  - If rising, will consider parasitic testing in setting of international travel.     Return in about 6 months (around 03/03/2024).  Alesia Morin, MD [facility]

## 2023-09-04 NOTE — Progress Notes (Signed)
Specialty Pharmacy Refill Coordination Note  Jamie Melton is a 53 y.o. female contacted today regarding refills of specialty medication(s) Etanercept (Enbrel SureClick)   Patient requested Delivery   Delivery date: 09/12/23   Verified address: [address]  [place] [address]   Medication will be filled on 02.18.25.   This fill date is pending response to refill request from provider. Patient is aware and if they have not received fill by intended date they must follow up with pharmacy.

## 2023-09-05 ENCOUNTER — Other Ambulatory Visit (HOSPITAL_COMMUNITY): Payer: Self-pay

## 2023-09-05 ENCOUNTER — Other Ambulatory Visit: Payer: Self-pay

## 2023-09-05 LAB — CBC WITH DIFFERENTIAL/PLATELET
Basophils Absolute: 0.1 10*3/uL (ref 0.0–0.2)
Basos: 1 %
EOS (ABSOLUTE): 0.4 10*3/uL (ref 0.0–0.4)
Eos: 6 %
Hematocrit: 39 % (ref 34.0–46.6)
Hemoglobin: 13.2 g/dL (ref 11.1–15.9)
Immature Grans (Abs): 0 10*3/uL (ref 0.0–0.1)
Immature Granulocytes: 0 %
Lymphocytes Absolute: 3.2 10*3/uL — ABNORMAL HIGH (ref 0.7–3.1)
Lymphs: 41 %
MCH: 32.5 pg (ref 26.6–33.0)
MCHC: 33.8 g/dL (ref 31.5–35.7)
MCV: 96 fL (ref 79–97)
Monocytes Absolute: 0.5 10*3/uL (ref 0.1–0.9)
Monocytes: 7 %
Neutrophils Absolute: 3.5 10*3/uL (ref 1.4–7.0)
Neutrophils: 45 %
Platelets: 214 10*3/uL (ref 150–450)
RBC: 4.06 x10E6/uL (ref 3.77–5.28)
RDW: 11.8 % (ref 11.7–15.4)
WBC: 7.8 10*3/uL (ref 3.4–10.8)

## 2023-09-10 ENCOUNTER — Other Ambulatory Visit: Payer: Self-pay

## 2023-09-10 NOTE — Progress Notes (Signed)
Patient was contacted via mychart that due to possible impending winter storm, medication will arrive on Tuesday 09/11/23.

## 2023-09-19 ENCOUNTER — Other Ambulatory Visit (HOSPITAL_COMMUNITY): Payer: Self-pay

## 2023-09-19 DIAGNOSIS — M4690 Unspecified inflammatory spondylopathy, site unspecified: Secondary | ICD-10-CM | POA: Diagnosis not present

## 2023-09-19 DIAGNOSIS — Z79899 Other long term (current) drug therapy: Secondary | ICD-10-CM | POA: Diagnosis not present

## 2023-09-19 DIAGNOSIS — Z227 Latent tuberculosis: Secondary | ICD-10-CM | POA: Diagnosis not present

## 2023-09-19 DIAGNOSIS — M459 Ankylosing spondylitis of unspecified sites in spine: Secondary | ICD-10-CM | POA: Diagnosis not present

## 2023-09-19 MED ORDER — PREDNISONE 5 MG PO TABS
ORAL_TABLET | ORAL | 0 refills | Status: AC
Start: 2023-09-19 — End: 2023-10-03
  Filled 2023-09-19: qty 42, 12d supply, fill #0

## 2023-09-21 ENCOUNTER — Other Ambulatory Visit (HOSPITAL_COMMUNITY): Payer: Self-pay

## 2023-09-24 ENCOUNTER — Other Ambulatory Visit: Payer: Self-pay

## 2023-09-24 ENCOUNTER — Other Ambulatory Visit (HOSPITAL_COMMUNITY): Payer: Self-pay

## 2023-10-04 ENCOUNTER — Other Ambulatory Visit (HOSPITAL_COMMUNITY): Payer: Self-pay

## 2023-10-05 ENCOUNTER — Other Ambulatory Visit: Payer: Self-pay

## 2023-10-05 ENCOUNTER — Other Ambulatory Visit: Payer: Self-pay | Admitting: Pharmacist

## 2023-10-05 MED ORDER — ENBREL SURECLICK 50 MG/ML ~~LOC~~ SOAJ: 50.0000 mg | SUBCUTANEOUS | 0 refills | Status: DC

## 2023-10-05 MED ORDER — ENBREL SURECLICK 50 MG/ML ~~LOC~~ SOAJ
50.0000 mg | SUBCUTANEOUS | 0 refills | Status: DC
  Filled 2023-10-05: qty 4, 28d supply, fill #0

## 2023-10-05 NOTE — Progress Notes (Signed)
 Specialty Pharmacy Refill Coordination Note  Jamie Melton is a 53 y.o. female contacted today regarding refills of specialty medication(s) Etanercept (Enbrel SureClick)   Patient requested (Patient-Rptd) Delivery   Delivery date: (Patient-Rptd) 10/12/23   Verified address: (Patient-Rptd) [address] [place] [address]   Medication will be filled on 10/11/23.

## 2023-10-11 ENCOUNTER — Other Ambulatory Visit: Payer: Self-pay

## 2023-11-01 ENCOUNTER — Other Ambulatory Visit: Payer: Self-pay

## 2023-11-06 ENCOUNTER — Other Ambulatory Visit: Payer: Self-pay | Admitting: Pharmacist

## 2023-11-06 ENCOUNTER — Other Ambulatory Visit: Payer: Self-pay

## 2023-11-06 ENCOUNTER — Other Ambulatory Visit (HOSPITAL_COMMUNITY): Payer: Self-pay

## 2023-11-06 MED ORDER — ENBREL SURECLICK 50 MG/ML ~~LOC~~ SOAJ
50.0000 mg | SUBCUTANEOUS | 0 refills | Status: DC
  Filled 2023-11-06 (×2): qty 4, 28d supply, fill #0

## 2023-11-06 MED ORDER — ENBREL SURECLICK 50 MG/ML ~~LOC~~ SOAJ: 50.0000 mg | SUBCUTANEOUS | 0 refills | Status: DC

## 2023-11-06 NOTE — Progress Notes (Signed)
 Specialty Pharmacy Refill Coordination Note  Jamie Melton is a 53 y.o. female contacted today regarding refills of specialty medication(s) Etanercept (Enbrel SureClick)   Patient requested Delivery   Delivery date: 11/15/23   Verified address: [address] [place] [address]   Medication will be filled on 04.23.25.   This fill date is pending response to refill request from provider. Patient is aware and if they have not received fill by intended date they must follow up with pharmacy.

## 2023-11-14 ENCOUNTER — Other Ambulatory Visit: Payer: Self-pay

## 2023-11-16 ENCOUNTER — Ambulatory Visit: Payer: 59 | Admitting: Internal Medicine

## 2023-11-23 ENCOUNTER — Other Ambulatory Visit (HOSPITAL_COMMUNITY): Payer: Self-pay

## 2023-11-26 ENCOUNTER — Ambulatory Visit (INDEPENDENT_AMBULATORY_CARE_PROVIDER_SITE_OTHER): Payer: 59 | Admitting: Internal Medicine

## 2023-11-26 ENCOUNTER — Encounter: Payer: Self-pay | Admitting: Internal Medicine

## 2023-11-26 VITALS — BP 110/70 | HR 97 | Ht 60.0 in | Wt 155.0 lb

## 2023-11-26 DIAGNOSIS — E042 Nontoxic multinodular goiter: Secondary | ICD-10-CM | POA: Diagnosis not present

## 2023-11-26 NOTE — Progress Notes (Unsigned)
 Name: Jamie Melton  MRN/ DOB: [id number], [birth year]    Age/ Sex: 53 y.o., female    PCP: Versa Gore, NP   Reason for Endocrinology Evaluation: MNG     Date of Initial Endocrinology Evaluation: 11/15/2022    HPI: Ms. Jamie Melton is a 53 y.o. female with a past medical history of ankylosing spondylitis, psoriatic arthritis, RA. The patient presented for initial endocrinology clinic visit on 11/15/2022 for consultative assistance with her MNG.   Patient has been diagnosed with multinodular goiter based on thyroid  ultrasound 11/2017   She is s/p benign FNA of the left inferior 2 cm nodule 12/2017  Patient follows with rheumatology, on methotrexate, glucocorticoids  Mother- hyperthyroidism  and sister - hyperthyroidism  Two cousins with thyroid  disease as well   SUBJECTIVE:    Today (11/26/23):  Jamie Melton is here for follow-up of multinodular goiter.  Weight overall stable  Denies any local neck swelling  Denies dysphagia She is perimenopausal and has hot flashes and insomnia , she does have a gynecologist  Denies palpitations  Has chronic constipation   No Biotin     HISTORY:  Past Medical History:  Past Medical History:  Diagnosis Date   Acute non-recurrent frontal sinusitis 08/30/2021   Asthma    DR KOSLOW   Asthma 08/02/2011   Chest pain 08/02/2011   Fibroid 2012   Fibromyalgia 02/15/2022   GERD (gastroesophageal reflux disease)    Headache(784.0)    FREQUENT   Infection    UTI   Pain in joint, multiple sites    Palpable abd. aorta 08/02/2011   PROM (premature rupture of membranes) at term 03/29/2013   Spondylosis    Thrombocytopenia, unspecified (HCC) 03/29/2013   Platelets 88 on admission. 140 at 28 weeks 218 at [facility].   Unspecified abortion, complicated by metabolic disorder, unspecified    Vaginal delivery 04/01/2013   Past Surgical History:  Past Surgical History:  Procedure Laterality Date   ABDOMINOPLASTY  07/10/2022   APPENDECTOMY      BILATERAL SALPINGECTOMY Bilateral 03/30/2013   Procedure: BILATERAL SALPINGECTOMY;  Surgeon: Stevenson Elbe, MD;  Location: [facility];  Service: Obstetrics;  Laterality: Bilateral;   CESAREAN SECTION N/A 03/30/2013   Procedure: Primary CESAREAN SECTION of baby  boy at 0521 APGAR 8/9;  Surgeon: Stevenson Elbe, MD;  Location: [facility];  Service: Obstetrics;  Laterality: N/A;   NASAL SINUS SURGERY      Social History:  reports that she has never smoked. She has been exposed to tobacco smoke. She has never used smokeless tobacco. She reports current alcohol use of about 3.0 standard drinks of alcohol per week. She reports that she does not use drugs. Family History: family history includes Allergic rhinitis in her sister, sister, and sister; Arthritis in her mother; Asthma in her sister, sister, and sister; Eczema in her sister, sister, and sister; Hypertension in her father; Kidney disease in her mother; Thyroid  disease in her mother.   HOME MEDICATIONS: Allergies as of 11/26/2023   No Known Allergies      Medication List        Accurate as of Nov 26, 2023 12:02 PM. If you have any questions, ask your nurse or doctor.          STOP taking these medications    ondansetron  4 MG tablet Commonly known as: ZOFRAN  Stopped by: Camilla Cedar Ranay Ketter       TAKE these medications    albuterol  108 (90  Base) MCG/ACT inhaler Commonly known as: VENTOLIN  HFA Inhale 2 puffs into the lungs every 6 (six) hours as needed.   ammonium lactate  12 % lotion Commonly known as: LAC-HYDRIN  Apply 1 Application topically as needed for dry skin.   BENADRYL  ALLERGY PO Take by mouth as needed.   budesonide -formoterol  80-4.5 MCG/ACT inhaler Commonly known as: SYMBICORT  Inhale 2 puffs into the lungs 2 (two) times daily.   chlorhexidine  0.12 % solution Commonly known as: PERIDEX  Rinse with 1/2 ounce for 30 seconds twice a day   Enbrel  SureClick 50 MG/ML injection Generic drug: etanercept  Inject  50 mg into the skin once a week.   fluticasone  50 MCG/ACT nasal spray Commonly known as: FLONASE  Place 2 sprays into both nostrils daily.   levocetirizine 5 MG tablet Commonly known as: XYZAL  Take 1 tablet (5 mg total) by mouth every evening.   Liquid Calcium with D3 600-12.5 MG-MCG Caps Generic drug: Calcium Carb-Cholecalciferol   multivitamin with minerals Tabs tablet Take 1 tablet by mouth daily.   Olopatadine  HCl 0.2 % Soln Place 1 drop into affected eye(s) once a day or  as needed   pantoprazole  20 MG tablet Commonly known as: PROTONIX  Take 1 tablet (20 mg total) by mouth daily. Take 1 pill twice a day for 1- 2 weeks, then 1 pill every morning until symptoms are improved.   polyethylene glycol-electrolytes 420 g solution Commonly known as: NuLYTELY Use as directed   tretinoin  0.025 % cream Commonly known as: RETIN-A  Apply 1 application topically every evening to face.          REVIEW OF SYSTEMS: A comprehensive ROS was conducted with the patient and is negative except as per HPI    OBJECTIVE:  VS: BP 110/70 (BP Location: Left Arm, Patient Position: Sitting, Cuff Size: Normal)   Pulse 97   Ht 5' (1.524 m)   Wt 155 lb (70.3 kg)   SpO2 98%   BMI 30.27 kg/m    Wt Readings from Last 3 Encounters:  11/26/23 155 lb (70.3 kg)  08/20/23 154 lb (69.9 kg)  05/01/23 151 lb (68.5 kg)     EXAM: General: Pt appears well and is in NAD  Neck: General: Supple without adenopathy. Thyroid : Thyroid  gland prominent   Lungs: Clear with good BS bilat   Heart: Auscultation: RRR.  Abdomen: soft, nontender  Extremities:  BL LE: No pretibial edema   Mental Status: Judgment, insight: Intact Orientation: Oriented to time, place, and person Mood and affect: No depression, anxiety, or agitation     DATA REVIEWED:   ***  Thyroid  Ultrasound 11/27/2022 Estimated total number of nodules >/= 1 cm: 3   Number of spongiform nodules >/=  2 cm not described below (TR1): 0    Number of mixed cystic and solid nodules >/= 1.5 cm not described below (TR2): 0   _________________________________________________________   Nodule # 1: Bilobed nodule in the left aspect of the thyroid  isthmus is essentially unchanged at 1.1 x 1.0 x 0.7 cm compared to 1.1 x 1.1 x 0.7 cm previously. This lesion now demonstrates 5 years of stability and is consistent with benignity.   Nodule # 2: Mixed cystic and solid nodule in the left mid gland remains consistent with TI-RADS category 2 and does not warrant further evaluation. This nodule does NOT meet TI-RADS criteria for biopsy or dedicated follow-up.   Nodule # 3: Previously biopsied nodule in the left inferior gland is slowly decreasing in size now measuring 1.7 x 1.5 x  1.9 cm compared to 2.2 x 1.7 x 1.0 cm. Involution over time is consistent with benignity.   No new nodules or suspicious features.   IMPRESSION: 1. Heterogeneous multinodular thyroid  gland most consistent with multinodular goiter. 2. Involuting nodule in the left inferior gland was previously biopsy. Involution over time is consistent with benignity. 3. Isthmic nodule demonstrates 5 years of stability and is considered benign. 4. No new nodules or suspicious features.     FNA left inferior 2 cm nodule 12/25/2017  THYROID , FINE NEEDLE ASPIRARION, LEFT INFERIOR (SPECIMEN 1 OF 1 COLLECTED 12-25-2017) CONSISTENT WITH BENIGN FOLLICULAR NODULE (BETHESDA CATEGORY II). Old records , labs and images have been reviewed.   ASSESSMENT/PLAN/RECOMMENDATIONS:   Multinodular goiter:  -No local neck symptoms -She is s/p benign FNA of the left thyroid  nodule in 2019 -Repeat thyroid  ultrasound shows stability in 2021, and decrease in previously biopsied nodule in 2024. - No further serial ultrasound would be recommended unless the patient develops local neck symptoms - TFTs***    Follow-up as needed   Signed electronically by: Natale Bail,  MD  [facility]  [facility] [address] [place] [place], [place] [address] Phone: [phone] FAX: [phone]   CC: Versa Gore, NP [address] [place] [address] Phone: [phone] Fax: [phone]   Return to Endocrinology clinic as below: No future appointments.

## 2023-11-27 ENCOUNTER — Encounter: Payer: Self-pay | Admitting: Internal Medicine

## 2023-11-27 LAB — TSH: TSH: 1.37 m[IU]/L

## 2023-11-27 LAB — T4, FREE: Free T4: 1.1 ng/dL (ref 0.8–1.8)

## 2023-11-27 LAB — T3, FREE: T3, Free: 3.6 pg/mL (ref 2.3–4.2)

## 2023-12-05 ENCOUNTER — Ambulatory Visit: Admitting: Family

## 2023-12-05 ENCOUNTER — Encounter: Payer: Self-pay | Admitting: Family

## 2023-12-05 VITALS — BP 100/63 | HR 78 | Temp 98.4°F | Ht 60.0 in | Wt 157.1 lb

## 2023-12-05 DIAGNOSIS — R1033 Periumbilical pain: Secondary | ICD-10-CM | POA: Diagnosis not present

## 2023-12-05 NOTE — Progress Notes (Signed)
 Patient ID: Jamie Melton, female    DOB: [birth year], 53 y.o.   MRN: [id number]  Chief Complaint  Patient presents with   Abdominal Pain    Pt c/o intermittent pain on left side since January.   Discussed the use of AI scribe software for clinical note transcription with the patient, who gave verbal consent to proceed.  History of Present Illness Jamie Melton is a 53 year old female who presents with intermittent left upper abdominal pain.  She experiences a dull, aching pain under her left breast/rib cage that is intermittent and lasts 15 to 30 minutes. The pain occurs at various times, day and night, but does not wake her from sleep. It is not triggered by movement or meals and is not associated with nausea, heartburn, or reflux. Protonix  does not alleviate the pain, and she does not think it is flatulence pain. She denies any associated urinary symptoms.  Bowel movements are regular with stool softeners, and there are no changes in bowel habits, nausea, diarrhea, or fever. Gas-X does not provide relief, and there is no significant increase in gas or burping.  Assessment & Plan Abdominal pain - Intermittent left upper quadrant pain, unlikely related to meals or bowel movements. Differential includes hiatal hernia and gas-related discomfort. Protonix  ineffective. - Order abdominal ultrasound to evaluate anatomical structures. - Recommend extra strength simethicone  (GasX) for gas-related discomfort. - Advise avoiding gassy foods and drinking through a straw. - Schedule ultrasound at [facility]. - Advise seeking immediate care if symptoms worsen.    Subjective:     Outpatient Medications Prior to Visit  Medication Sig Dispense Refill   albuterol  (VENTOLIN  HFA) 108 (90 Base) MCG/ACT inhaler Inhale 2 puffs into the lungs every 6 (six) hours as needed. 6.7 g 1   ammonium lactate  (LAC-HYDRIN ) 12 % lotion Apply 1 Application topically as needed for dry skin. 400 g 5    budesonide -formoterol  (SYMBICORT ) 80-4.5 MCG/ACT inhaler Inhale 2 puffs into the lungs 2 (two) times daily. 10.2 g 5   Calcium Carb-Cholecalciferol (LIQUID CALCIUM WITH D3) 600-12.5 MG-MCG CAPS      chlorhexidine  (PERIDEX ) 0.12 % solution Rinse with 1/2 ounce for 30 seconds twice a day 473 mL 1   etanercept  (ENBREL  SURECLICK) 50 MG/ML injection Inject 50 mg into the skin once a week. 4 mL 0   fluticasone  (FLONASE ) 50 MCG/ACT nasal spray Place 2 sprays into both nostrils daily. 16 g 6   levocetirizine (XYZAL ) 5 MG tablet Take 1 tablet (5 mg total) by mouth every evening. 30 tablet 5   Multiple Vitamin (MULTIVITAMIN WITH MINERALS) TABS tablet Take 1 tablet by mouth daily.     Olopatadine  HCl 0.2 % SOLN Place 1 drop into affected eye(s) once a day or  as needed 2.5 mL 5   pantoprazole  (PROTONIX ) 20 MG tablet Take 1 tablet (20 mg total) by mouth daily. Take 1 pill twice a day for 1- 2 weeks, then 1 pill every morning until symptoms are improved. 45 tablet 2   polyethylene glycol-electrolytes (NULYTELY) 420 g solution Use as directed 4000 mL 0   tretinoin  (RETIN-A ) 0.025 % cream Apply 1 application topically every evening to face. 45 g 3   diphenhydrAMINE  HCl (BENADRYL  ALLERGY PO) Take by mouth as needed.     No facility-administered medications prior to visit.   Past Medical History:  Diagnosis Date   Acute non-recurrent frontal sinusitis 08/30/2021   Asthma    DR KOSLOW   Asthma 08/02/2011  Chest pain 08/02/2011   Fibroid 2012   Fibromyalgia 02/15/2022   GERD (gastroesophageal reflux disease)    Headache(784.0)    FREQUENT   Infection    UTI   Pain in joint, multiple sites    Palpable abd. aorta 08/02/2011   PROM (premature rupture of membranes) at term 03/29/2013   Spondylosis    Thrombocytopenia, unspecified (HCC) 03/29/2013   Platelets 88 on admission. 140 at 28 weeks 218 at [facility]B.   Unspecified abortion, complicated by metabolic disorder, unspecified    Vaginal delivery 04/01/2013   Past  Surgical History:  Procedure Laterality Date   ABDOMINOPLASTY  07/10/2022   APPENDECTOMY     BILATERAL SALPINGECTOMY Bilateral 03/30/2013   Procedure: BILATERAL SALPINGECTOMY;  Surgeon: Stevenson Elbe, MD;  Location: [facility];  Service: Obstetrics;  Laterality: Bilateral;   CESAREAN SECTION N/A 03/30/2013   Procedure: Primary CESAREAN SECTION of baby  boy at 0521 APGAR 8/9;  Surgeon: Stevenson Elbe, MD;  Location: [facility];  Service: Obstetrics;  Laterality: N/A;   NASAL SINUS SURGERY     No Known Allergies    Objective:    Physical Exam Vitals and nursing note reviewed.  Constitutional:      Appearance: Normal appearance.  Cardiovascular:     Rate and Rhythm: Normal rate and regular rhythm.  Pulmonary:     Effort: Pulmonary effort is normal.     Breath sounds: Normal breath sounds.  Abdominal:     Tenderness: There is abdominal tenderness (no pain w/palpation in LUQ where she states she feels, but same pain in periumbilical w/palpation) in the periumbilical area. There is guarding. Negative signs include Murphy's sign and McBurney's sign.  Musculoskeletal:        General: Normal range of motion.  Skin:    General: Skin is warm and dry.  Neurological:     Mental Status: She is alert.  Psychiatric:        Mood and Affect: Mood normal.        Behavior: Behavior normal.    BP 100/63 (BP Location: Left Arm, Patient Position: Sitting, Cuff Size: Large)   Pulse 78   Temp 98.4 F (36.9 C) (Temporal)   Ht 5' (1.524 m)   Wt 157 lb 2 oz (71.3 kg)   SpO2 99%   BMI 30.69 kg/m  Wt Readings from Last 3 Encounters:  12/05/23 157 lb 2 oz (71.3 kg)  11/26/23 155 lb (70.3 kg)  08/20/23 154 lb (69.9 kg)      Versa Gore, NP

## 2023-12-07 ENCOUNTER — Other Ambulatory Visit: Payer: Self-pay

## 2023-12-07 ENCOUNTER — Other Ambulatory Visit (HOSPITAL_COMMUNITY): Payer: Self-pay

## 2023-12-07 ENCOUNTER — Other Ambulatory Visit: Payer: Self-pay | Admitting: Pharmacist

## 2023-12-07 MED ORDER — ENBREL SURECLICK 50 MG/ML ~~LOC~~ SOAJ
50.0000 mg | SUBCUTANEOUS | 0 refills | Status: DC
  Filled 2023-12-07: qty 4, 28d supply, fill #0

## 2023-12-07 MED ORDER — ENBREL SURECLICK 50 MG/ML ~~LOC~~ SOAJ
50.0000 mg | SUBCUTANEOUS | 0 refills | Status: DC
Start: 2023-12-07 — End: 2023-12-07
  Filled 2023-12-07: qty 4, 28d supply, fill #0

## 2023-12-07 NOTE — Progress Notes (Signed)
 Specialty Pharmacy Refill Coordination Note  Jamie Melton is a 53 y.o. female contacted today regarding refills of specialty medication(s) Etanercept  (Enbrel  SureClick)   Patient requested (Patient-Rptd) Delivery   Delivery date: (Patient-Rptd) 12/14/23   Verified address: (Patient-Rptd) [address] [place] [address]   Medication will be filled on 12/13/23, pending refill approval.     Send to [facility] for rewrite.

## 2023-12-10 ENCOUNTER — Other Ambulatory Visit (HOSPITAL_COMMUNITY): Payer: Self-pay

## 2023-12-13 ENCOUNTER — Other Ambulatory Visit: Payer: Self-pay

## 2023-12-13 NOTE — Progress Notes (Signed)
 Pharmacy Patient Advocate Encounter   Received notification from Patient Pharmacy that prior authorization for Enbrel  is required/requested.   Insurance verification completed.   The patient is insured through [facility] .   Per test claim: PA required; PA submitted to above mentioned insurance via CoverMyMeds Key/confirmation #/EOC [id number] Status is pending

## 2023-12-13 NOTE — Progress Notes (Signed)
 PA req. - sent to tiffany

## 2023-12-13 NOTE — Progress Notes (Signed)
PA submitted and pt notified

## 2023-12-14 ENCOUNTER — Other Ambulatory Visit: Payer: Self-pay

## 2023-12-14 ENCOUNTER — Ambulatory Visit (HOSPITAL_BASED_OUTPATIENT_CLINIC_OR_DEPARTMENT_OTHER)
Admission: RE | Admit: 2023-12-14 | Discharge: 2023-12-14 | Disposition: A | Discharge status: Home / Self Care | Source: Ambulatory Visit | Attending: Family | Admitting: Family

## 2023-12-14 DIAGNOSIS — R1033 Periumbilical pain: Secondary | ICD-10-CM | POA: Diagnosis not present

## 2023-12-14 DIAGNOSIS — R109 Unspecified abdominal pain: Secondary | ICD-10-CM | POA: Diagnosis not present

## 2023-12-14 NOTE — Progress Notes (Signed)
 Pharmacy Patient Advocate Encounter  Received notification from [facility] that Prior Authorization for Enbrel  has been APPROVED from 12/14/23 to 12/12/24   PA #/Case ID/Reference #: [id number]

## 2023-12-14 NOTE — Progress Notes (Signed)
 PA approved and pt updated

## 2023-12-18 ENCOUNTER — Ambulatory Visit: Payer: Self-pay | Admitting: Family

## 2023-12-18 ENCOUNTER — Other Ambulatory Visit: Payer: Self-pay

## 2023-12-18 DIAGNOSIS — R1033 Periumbilical pain: Secondary | ICD-10-CM

## 2023-12-18 DIAGNOSIS — K219 Gastro-esophageal reflux disease without esophagitis: Secondary | ICD-10-CM

## 2024-01-01 ENCOUNTER — Other Ambulatory Visit (HOSPITAL_COMMUNITY): Payer: Self-pay

## 2024-01-01 ENCOUNTER — Other Ambulatory Visit: Payer: Self-pay

## 2024-01-03 ENCOUNTER — Other Ambulatory Visit: Payer: Self-pay

## 2024-01-07 ENCOUNTER — Other Ambulatory Visit: Payer: Self-pay

## 2024-01-08 ENCOUNTER — Encounter (INDEPENDENT_AMBULATORY_CARE_PROVIDER_SITE_OTHER): Payer: Self-pay

## 2024-01-08 ENCOUNTER — Other Ambulatory Visit: Payer: Self-pay

## 2024-01-09 ENCOUNTER — Other Ambulatory Visit: Payer: Self-pay

## 2024-01-09 ENCOUNTER — Encounter: Payer: Self-pay | Admitting: Gastroenterology

## 2024-01-09 MED ORDER — ENBREL SURECLICK 50 MG/ML ~~LOC~~ SOAJ: 50.0000 mg | SUBCUTANEOUS | 0 refills | Status: DC

## 2024-01-09 NOTE — Progress Notes (Signed)
 Specialty Pharmacy Refill Coordination Note  Jamie Melton is a 53 y.o. female contacted today regarding refills of specialty medication(s) Etanercept  (Enbrel  SureClick)   Patient requested Delivery   Delivery date: 01/11/24   Verified address: [address] [place] [address]   Medication will be filled on 01/10/24.   This fill date is pending response to refill request from provider. Patient is aware and if they have not received fill by intended date they must follow up with pharmacy.

## 2024-01-09 NOTE — Progress Notes (Signed)
 Clinical Intervention Note  Clinical Intervention Notes: On questionnaire, patient asked if Enbrel  could contribute to potential fatty liver. Per [employer], Enbrel  carries a low risk of inducing liver toxicity and any ALT elevations are usually asymptomatic, transient, and do not require dose changes. Called patient to let her know that it was more than likely not inducing fatty liver, but to continue with gastro followup.   Clinical Intervention Outcomes: Improved therapy effectiveness; Prevention of an adverse drug event   [facility]

## 2024-01-10 ENCOUNTER — Other Ambulatory Visit: Payer: Self-pay

## 2024-01-10 ENCOUNTER — Other Ambulatory Visit (HOSPITAL_COMMUNITY): Payer: Self-pay

## 2024-01-10 ENCOUNTER — Other Ambulatory Visit: Payer: Self-pay | Admitting: Pharmacist

## 2024-01-10 MED ORDER — ENBREL SURECLICK 50 MG/ML ~~LOC~~ SOAJ
50.0000 mg | SUBCUTANEOUS | 0 refills | Status: DC
  Filled 2024-01-10: qty 4, 28d supply, fill #0

## 2024-01-15 ENCOUNTER — Other Ambulatory Visit (HOSPITAL_COMMUNITY): Payer: Self-pay

## 2024-01-15 MED ORDER — AMOXICILLIN 250 MG PO CAPS
250.0000 mg | ORAL_CAPSULE | Freq: Four times a day (QID) | ORAL | 0 refills | Status: DC
  Filled 2024-01-15: qty 30, 7d supply, fill #0
  Filled 2024-01-15: qty 10, 3d supply, fill #0
  Filled 2024-01-16: qty 40, 10d supply, fill #0

## 2024-01-16 ENCOUNTER — Other Ambulatory Visit (HOSPITAL_COMMUNITY): Payer: Self-pay

## 2024-01-16 ENCOUNTER — Other Ambulatory Visit: Payer: Self-pay

## 2024-01-17 ENCOUNTER — Other Ambulatory Visit (HOSPITAL_COMMUNITY): Payer: Self-pay

## 2024-01-17 ENCOUNTER — Encounter (HOSPITAL_COMMUNITY): Payer: Self-pay

## 2024-01-18 DIAGNOSIS — M459 Ankylosing spondylitis of unspecified sites in spine: Secondary | ICD-10-CM | POA: Diagnosis not present

## 2024-01-18 DIAGNOSIS — M2550 Pain in unspecified joint: Secondary | ICD-10-CM | POA: Diagnosis not present

## 2024-01-18 DIAGNOSIS — M4690 Unspecified inflammatory spondylopathy, site unspecified: Secondary | ICD-10-CM | POA: Diagnosis not present

## 2024-01-18 DIAGNOSIS — Z79899 Other long term (current) drug therapy: Secondary | ICD-10-CM | POA: Diagnosis not present

## 2024-02-06 ENCOUNTER — Other Ambulatory Visit: Payer: Self-pay

## 2024-02-06 MED ORDER — ENBREL SURECLICK 50 MG/ML ~~LOC~~ SOAJ: 50.0000 mg | SUBCUTANEOUS | 0 refills | Status: DC

## 2024-02-06 NOTE — Progress Notes (Signed)
 Specialty Pharmacy Refill Coordination Note  Jamie Melton is a 53 y.o. female contacted today regarding refills of specialty medication(s) Etanercept  (Enbrel  SureClick)   Patient requested Delivery   Delivery date: 02/21/24   Verified address: [address] [place] [address]   Medication will be filled on 02/20/24.   Refill request sent ot original provider. Send to [facility] for rewrite.

## 2024-02-07 ENCOUNTER — Other Ambulatory Visit: Payer: Self-pay

## 2024-02-07 ENCOUNTER — Other Ambulatory Visit (HOSPITAL_COMMUNITY): Payer: Self-pay

## 2024-02-07 ENCOUNTER — Other Ambulatory Visit: Payer: Self-pay | Admitting: Pharmacist

## 2024-02-07 MED ORDER — ENBREL SURECLICK 50 MG/ML ~~LOC~~ SOAJ
50.0000 mg | SUBCUTANEOUS | 0 refills | Status: DC
  Filled 2024-02-07: qty 4, 28d supply, fill #0

## 2024-02-12 DIAGNOSIS — Z124 Encounter for screening for malignant neoplasm of cervix: Secondary | ICD-10-CM | POA: Diagnosis not present

## 2024-02-12 DIAGNOSIS — N911 Secondary amenorrhea: Secondary | ICD-10-CM | POA: Diagnosis not present

## 2024-02-12 DIAGNOSIS — Z1231 Encounter for screening mammogram for malignant neoplasm of breast: Secondary | ICD-10-CM | POA: Diagnosis not present

## 2024-02-12 DIAGNOSIS — N951 Menopausal and female climacteric states: Secondary | ICD-10-CM | POA: Diagnosis not present

## 2024-02-12 DIAGNOSIS — Z01411 Encounter for gynecological examination (general) (routine) with abnormal findings: Secondary | ICD-10-CM | POA: Diagnosis not present

## 2024-02-12 LAB — HM MAMMOGRAPHY

## 2024-02-14 ENCOUNTER — Encounter: Payer: Self-pay | Admitting: Family

## 2024-02-15 ENCOUNTER — Encounter: Payer: Self-pay | Admitting: Family

## 2024-02-19 ENCOUNTER — Other Ambulatory Visit: Payer: Self-pay

## 2024-02-20 ENCOUNTER — Other Ambulatory Visit: Payer: Self-pay

## 2024-03-03 ENCOUNTER — Ambulatory Visit: Admitting: Gastroenterology

## 2024-03-07 ENCOUNTER — Other Ambulatory Visit (HOSPITAL_COMMUNITY): Payer: Self-pay

## 2024-03-11 ENCOUNTER — Other Ambulatory Visit: Payer: Self-pay

## 2024-03-12 ENCOUNTER — Other Ambulatory Visit (HOSPITAL_COMMUNITY): Payer: Self-pay

## 2024-03-17 ENCOUNTER — Other Ambulatory Visit (HOSPITAL_COMMUNITY): Payer: Self-pay

## 2024-03-17 ENCOUNTER — Encounter: Payer: Self-pay | Admitting: Family

## 2024-03-17 ENCOUNTER — Encounter (INDEPENDENT_AMBULATORY_CARE_PROVIDER_SITE_OTHER): Payer: Self-pay

## 2024-03-17 ENCOUNTER — Ambulatory Visit (INDEPENDENT_AMBULATORY_CARE_PROVIDER_SITE_OTHER): Admitting: Family

## 2024-03-17 ENCOUNTER — Other Ambulatory Visit: Payer: Self-pay | Admitting: Pharmacist

## 2024-03-17 ENCOUNTER — Other Ambulatory Visit: Payer: Self-pay

## 2024-03-17 VITALS — BP 99/63 | HR 69 | Temp 97.5°F | Ht 60.0 in | Wt 155.5 lb

## 2024-03-17 DIAGNOSIS — Z0001 Encounter for general adult medical examination with abnormal findings: Secondary | ICD-10-CM | POA: Diagnosis not present

## 2024-03-17 DIAGNOSIS — N941 Unspecified dyspareunia: Secondary | ICD-10-CM

## 2024-03-17 DIAGNOSIS — E669 Obesity, unspecified: Secondary | ICD-10-CM | POA: Diagnosis not present

## 2024-03-17 DIAGNOSIS — N898 Other specified noninflammatory disorders of vagina: Secondary | ICD-10-CM

## 2024-03-17 DIAGNOSIS — Z0000 Encounter for general adult medical examination without abnormal findings: Secondary | ICD-10-CM

## 2024-03-17 DIAGNOSIS — K5909 Other constipation: Secondary | ICD-10-CM | POA: Diagnosis not present

## 2024-03-17 LAB — CBC WITH DIFFERENTIAL/PLATELET
Basophils Absolute: 0 K/uL (ref 0.0–0.1)
Basophils Relative: 0.6 % (ref 0.0–3.0)
Eosinophils Absolute: 0.6 K/uL (ref 0.0–0.7)
Eosinophils Relative: 9 % — ABNORMAL HIGH (ref 0.0–5.0)
HCT: 38.1 % (ref 36.0–46.0)
Hemoglobin: 12.8 g/dL (ref 12.0–15.0)
Lymphocytes Relative: 29.9 % (ref 12.0–46.0)
Lymphs Abs: 1.9 K/uL (ref 0.7–4.0)
MCHC: 33.5 g/dL (ref 30.0–36.0)
MCV: 96 fl (ref 78.0–100.0)
Monocytes Absolute: 0.4 K/uL (ref 0.1–1.0)
Monocytes Relative: 6.7 % (ref 3.0–12.0)
Neutro Abs: 3.4 K/uL (ref 1.4–7.7)
Neutrophils Relative %: 53.8 % (ref 43.0–77.0)
Platelets: 192 K/uL (ref 150.0–400.0)
RBC: 3.97 Mil/uL (ref 3.87–5.11)
RDW: 12.8 % (ref 11.5–15.5)
WBC: 6.4 K/uL (ref 4.0–10.5)

## 2024-03-17 LAB — LIPID PANEL
Cholesterol: 166 mg/dL (ref 0–200)
HDL: 61.5 mg/dL (ref >39.00)
LDL Cholesterol: 94 mg/dL (ref 0–99)
NonHDL: 104.29
Total CHOL/HDL Ratio: 3
Triglycerides: 53 mg/dL (ref 0.0–149.0)
VLDL: 10.6 mg/dL (ref 0.0–40.0)

## 2024-03-17 LAB — COMPREHENSIVE METABOLIC PANEL WITH GFR
ALT: 19 U/L (ref 0–35)
AST: 18 U/L (ref 0–37)
Albumin: 4.2 g/dL (ref 3.5–5.2)
Alkaline Phosphatase: 71 U/L (ref 39–117)
BUN: 13 mg/dL (ref 6–23)
CO2: 29 meq/L (ref 19–32)
Calcium: 9.4 mg/dL (ref 8.4–10.5)
Chloride: 103 meq/L (ref 96–112)
Creatinine, Ser: 0.79 mg/dL (ref 0.40–1.20)
GFR: 86.27 mL/min (ref >60.00)
Glucose, Bld: 86 mg/dL (ref 70–99)
Potassium: 3.9 meq/L (ref 3.5–5.1)
Sodium: 140 meq/L (ref 135–145)
Total Bilirubin: 0.3 mg/dL (ref 0.2–1.2)
Total Protein: 7.5 g/dL (ref 6.0–8.3)

## 2024-03-17 LAB — TSH: TSH: 1.57 u[IU]/mL (ref 0.35–5.50)

## 2024-03-17 MED ORDER — ESTRADIOL 10 MCG VA TABS
1.0000 | ORAL_TABLET | VAGINAL | 0 refills | Status: DC
  Filled 2024-03-17: qty 24, 32d supply, fill #0

## 2024-03-17 MED ORDER — ENBREL SURECLICK 50 MG/ML ~~LOC~~ SOAJ
SUBCUTANEOUS | 2 refills | Status: DC
  Filled 2024-03-17: qty 4, fill #0
  Filled 2024-03-18: qty 4, 28d supply, fill #0
  Filled 2024-04-17: qty 4, 28d supply, fill #1
  Filled 2024-05-20: qty 4, 28d supply, fill #2

## 2024-03-17 MED ORDER — ENBREL SURECLICK 50 MG/ML ~~LOC~~ SOAJ: SUBCUTANEOUS | 2 refills | Status: DC

## 2024-03-17 MED ORDER — IMVEXXY STARTER PACK 10 MCG VA INST
VAGINAL_INSERT | VAGINAL | 0 refills | Status: AC
  Filled 2024-03-17: qty 18, 28d supply, fill #0

## 2024-03-17 NOTE — Assessment & Plan Note (Signed)
 Vaginal dryness due to menopausal atrophy. Discussed local estrogen therapy options. - Prescribe local estrogen therapy, generic Vagifem  10mcg vaginal pills, advised on use & possible SE. - Monitor for any concerns or questions regarding the use of vaginal estrogen. - F/U in 6 mos or prn

## 2024-03-17 NOTE — Progress Notes (Signed)
 Phone [phone]  Subjective:   Patient is a 53 y.o. female presenting for annual physical.    Chief Complaint  Patient presents with   Annual Exam    Non Fasting w/ labs  Discussed the use of AI scribe software for clinical note transcription with the patient, who gave verbal consent to proceed.  History of Present Illness   Anderson Coppock Dearman is a 53 year old female who presents for an annual physical exam.  Menopausal symptoms - Sleep quality is suboptimal, possibly related to peri-menopause - Menstrual cycles occur every three to four months - Significant vaginal dryness present and some vaginal pain with intercourse - Is trying over-the-counter lubricants for vaginal dryness - Hesitant to use hormone treatments  Asthma - Asthma is well-controlled with inhalers  Gastrointestinal symptoms - Constipation present - Uses Miralax approximately three times per week - Occasionally uses Senokot  Preventive health maintenance - Weight remains stable - Pap smear performed one month ago - Mammogram completed this year and last year  Alcohol use - Alcohol consumption is rare and social - Reducing intake due to sugar content and effects on sleep  Lifestyle and recent travel - Recently returned from a three-week vacation in [place] - Continues to focus on maintaining a healthy lifestyle      See problem oriented charting- ROS- full  review of systems was completed and negative except for what is in HPI above.  The following were reviewed and entered/updated in epic: Past Medical History:  Diagnosis Date   Acute non-recurrent frontal sinusitis 08/30/2021   Asthma    DR KOSLOW   Asthma 08/02/2011   Chest pain 08/02/2011   Fibroid 2012   Fibromyalgia 02/15/2022   GERD (gastroesophageal reflux disease)    Headache(784.0)    FREQUENT   Infection    UTI   Pain in joint, multiple sites    Palpable abd. aorta 08/02/2011   PROM (premature rupture of membranes) at term 03/29/2013    Spondylosis    Thrombocytopenia, unspecified (HCC) 03/29/2013   Platelets 88 on admission. 140 at 28 weeks 218 at [facility].   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   Inflammatory arthritis 02/15/2022   Ankylosing spondylitis (HCC) 02/08/2022   Moderate persistent asthma without complication 02/08/2022   Encounter for sterilization 03/30/2013   Past Surgical History:  Procedure Laterality Date   ABDOMINOPLASTY  07/10/2022   APPENDECTOMY     BILATERAL SALPINGECTOMY Bilateral 03/30/2013   Procedure: BILATERAL SALPINGECTOMY;  Surgeon: Shanda SHAUNNA Muscat, MD;  Location: [facility];  Service: Obstetrics;  Laterality: Bilateral;   CESAREAN SECTION N/A 03/30/2013   Procedure: Primary CESAREAN SECTION of baby  boy at 0521 APGAR 8/9;  Surgeon: Shanda SHAUNNA Muscat, MD;  Location: [facility];  Service: Obstetrics;  Laterality: N/A;   NASAL SINUS SURGERY      Family History  Problem Relation Age of Onset   Arthritis Mother    Kidney disease Mother        STONES   Thyroid  disease Mother  Hypertension Father    Asthma Sister    Eczema Sister    Allergic rhinitis Sister    Eczema Sister    Asthma Sister    Allergic rhinitis Sister    Eczema Sister    Asthma Sister    Allergic rhinitis Sister     Medications- reviewed and updated Current Outpatient Medications  Medication Sig Dispense Refill   albuterol  (VENTOLIN  HFA) 108 (90 Base) MCG/ACT inhaler Inhale 2 puffs into the lungs every 6 (six) hours as  needed. 6.7 g 1   budesonide -formoterol  (SYMBICORT ) 80-4.5 MCG/ACT inhaler Inhale 2 puffs into the lungs 2 (two) times daily. 10.2 g 5   Calcium Carb-Cholecalciferol (LIQUID CALCIUM WITH D3) 600-12.5 MG-MCG CAPS      Estradiol  10 MCG TABS vaginal tablet Place 1 tablet (10 mcg total) vaginally as directed. START with 1 pill at bedtime for 2 weeks, then reduce to 1 pill 3-4 nights per week. Call office for refill. 22 tablet 0   etanercept  (ENBREL  SURECLICK) 50 MG/ML injection Inject 50 mg into the skin once a week. 4 mL 0   etanercept  (ENBREL  SURECLICK) 50 MG/ML injection Inject 50 mg into the skin once a week. Subcutaneous 4 mL 2   fluticasone  (FLONASE ) 50 MCG/ACT nasal spray Place 2 sprays into both nostrils daily. 16 g 6   levocetirizine (XYZAL ) 5 MG tablet Take 1 tablet (5 mg total) by mouth every evening. 30 tablet 5   Multiple Vitamin (MULTIVITAMIN WITH MINERALS) TABS tablet Take 1 tablet by mouth daily.     pantoprazole  (PROTONIX ) 20 MG tablet Take 1 tablet (20 mg total) by mouth daily. Take 1 pill twice a day for 1- 2 weeks, then 1 pill every morning until symptoms are improved. 45 tablet 2   polyethylene glycol-electrolytes (NULYTELY) 420 g solution Use as directed 4000 mL 0   ammonium lactate  (LAC-HYDRIN ) 12 % lotion Apply 1 Application topically as needed for dry skin. 400 g 5   amoxicillin  (AMOXIL ) 250 MG capsule Take 1 capsule (250 mg total) by mouth every 6 (six) hours until gone. 40 capsule 0   chlorhexidine  (PERIDEX ) 0.12 % solution Rinse with 1/2 ounce for 30 seconds twice a day 473 mL 1   Olopatadine  HCl 0.2 % SOLN Place 1 drop into affected eye(s) once a day or  as needed 2.5 mL 5   tretinoin  (RETIN-A ) 0.025 % cream Apply 1 application topically every evening to face. 45 g 3   No current facility-administered medications for this visit.    Allergies-reviewed and updated No Known Allergies  Social History   Social History Narrative   Not on file    Objective:  BP 99/63  (BP Location: Left Arm, Patient Position: Sitting, Cuff Size: Large)   Pulse 69   Temp (!) 97.5 F (36.4 C) (Temporal)   Ht 5' (1.524 m)   Wt 155 lb 8 oz (70.5 kg)   SpO2 99%   BMI 30.37 kg/m  Physical Exam Vitals and nursing note reviewed.  Constitutional:      Appearance: Normal appearance.  HENT:     Head: Normocephalic.     Right Ear: Tympanic membrane normal.     Left Ear: Tympanic membrane normal.     Nose: Nose normal.     Mouth/Throat:     Mouth: Mucous membranes are moist.  Eyes:     Pupils: Pupils are equal, round, and reactive to light.  Cardiovascular:     Rate and Rhythm: Normal rate and regular  rhythm.  Pulmonary:     Effort: Pulmonary effort is normal.     Breath sounds: Normal breath sounds.  Musculoskeletal:        General: Normal range of motion.     Cervical back: Normal range of motion.  Lymphadenopathy:     Cervical: No cervical adenopathy.  Skin:    General: Skin is warm and dry.  Neurological:     Mental Status: She is alert.  Psychiatric:        Mood and Affect: Mood normal.        Behavior: Behavior normal.     Assessment and Plan   Health Maintenance counseling: 1. Anticipatory guidance: Patient counseled regarding regular dental exams q6 months, eye exams,  avoiding smoking and second hand smoke, limiting alcohol to 1 beverage per day, no illicit drugs.   2. Risk factor reduction:  Advised patient of need for regular exercise and diet rich with fruits and vegetables to reduce risk of heart attack and stroke. Exercise- none.  Wt Readings from Last 3 Encounters:  03/17/24 155 lb 8 oz (70.5 kg)  12/05/23 157 lb 2 oz (71.3 kg)  11/26/23 155 lb (70.3 kg)   3. Immunizations/screenings/ancillary studies Immunization History  Administered Date(s) Administered   Influenza-Unspecified 07/25/2011, 04/24/2015   Moderna SARS-COV2 Booster Vaccination 08/03/2020   PFIZER(Purple Top)SARS-COV-2 Vaccination 07/19/2019, 11/06/2019   Tdap 03/25/2013    There are no preventive care reminders to display for this patient.   4. Cervical cancer screening-  done 2024 5. Breast cancer screening-  mammogram: last done 2025 6. Colon cancer screening - done, requesting records. 7. Skin cancer screening- advised regular sunscreen use. Denies worrisome, changing, or new skin lesions.  8. Birth control/STD check- Tubal ligation/ N/A 9. Osteoporosis screening- N/A 10. Alcohol screening: rare 11. Smoking associated screening (lung cancer screening, AAA screen 65-75, UA)- non- smoker  Assessment and Plan    Adult Wellness Visit Routine wellness visit with focus on diet and exercise. Alcohol intake is rare and social. Pap smear and mammogram are up to date. - Perform routine labs. - Encourage regular exercise and healthy diet. - Continue routine Pap smears every five years unless otherwise indicated. - Continue annual or q2y mammograms.  Vaginal dryness (postmenopausal atrophic vaginitis) Vaginal dryness due to menopausal atrophy. Discussed local estrogen therapy options. - Prescribe local estrogen therapy, generic Vagifem  10mcg vaginal pills, advised on use & possible SE. - Monitor for any concerns or questions regarding the use of vaginal estrogen. - F/U in 6 mos or prn  Constipation Intermittent constipation managed with Miralax and occasional Senokot. Advised to focus on hydration and fiber intake. - Continue Miralax as needed. - Limit Senokot use. - Increase water intake to 2.5 liters daily. - Increase dietary fiber intake. - Consider Benefiber or Metamucil supplementation. - F/U prn  Asthma Asthma is well-controlled with current inhaler use. - Continue to follow with Asthma clinic.  Obesity Obesity management includes dietary modifications and increased physical activity. Encouraged to reduce saturated fats, red meat, and dairy, and increase seafood and fatty fish intake. - Encourage dietary modifications as discussed, avoid white  carbs & sweets, limit whole fruit to 2-3 servings per day. - Promote regular physical activity, focusing on cardio exercises as many days as able.     Recommended follow up:  Return for any future concerns, Complete physical w/fasting labs. Future Appointments  Date Time Provider Department Center  04/25/2024  9:30 AM Arletta Camie FORBES DEVONNA [facility]  Lab/Order associations:  not -fasting   Lucius Krabbe, NP

## 2024-03-17 NOTE — Patient Instructions (Addendum)
 It was very nice to see you today!   I will review your lab results via MyChart in a few days.  I have sent over the estradiol  vaginal pills. Let me know if any concerns after starting.  You look great! Stay well! Keep exercising!       PLEASE NOTE:  If you had any lab tests please let us  know if you have not heard back within a few days. You may see your results on MyChart before we have a chance to review them but we will give you a call once they are reviewed by us . If we ordered any referrals today, please let us  know if you have not heard from their office within the next week.

## 2024-03-18 ENCOUNTER — Other Ambulatory Visit: Payer: Self-pay

## 2024-03-18 ENCOUNTER — Other Ambulatory Visit (HOSPITAL_COMMUNITY): Payer: Self-pay

## 2024-03-18 MED ORDER — ENBREL SURECLICK 50 MG/ML ~~LOC~~ SOAJ: SUBCUTANEOUS | 2 refills | Status: DC

## 2024-03-18 NOTE — Progress Notes (Signed)
 Specialty Pharmacy Refill Coordination Note  Jamie Melton is a 53 y.o. female contacted today regarding refills of specialty medication(s) Etanercept  (Enbrel  SureClick)   Patient requested (Patient-Rptd) Delivery   Delivery date: 03/28/24   Verified address: (Patient-Rptd) [address] [place] [address]   Medication will be filled on 03/27/24.

## 2024-03-27 ENCOUNTER — Other Ambulatory Visit: Payer: Self-pay

## 2024-04-02 ENCOUNTER — Other Ambulatory Visit: Payer: Self-pay

## 2024-04-07 ENCOUNTER — Other Ambulatory Visit (HOSPITAL_COMMUNITY): Payer: Self-pay

## 2024-04-14 ENCOUNTER — Other Ambulatory Visit (HOSPITAL_COMMUNITY): Payer: Self-pay

## 2024-04-17 ENCOUNTER — Other Ambulatory Visit: Payer: Self-pay

## 2024-04-17 ENCOUNTER — Encounter (INDEPENDENT_AMBULATORY_CARE_PROVIDER_SITE_OTHER): Payer: Self-pay

## 2024-04-17 NOTE — Progress Notes (Signed)
 Specialty Pharmacy Refill Coordination Note  Jamie Melton is a 53 y.o. female contacted today regarding refills of specialty medication(s) Etanercept  (Enbrel  SureClick)   Patient requested (Patient-Rptd) Delivery   Delivery date: 04/30/24   Verified address: (Patient-Rptd) [address] [place] [address]   Medication will be filled on 04/29/24.

## 2024-04-24 NOTE — Progress Notes (Addendum)
 Jamie Melton [id number] [birth year]   Chief Complaint: Abdominal pain, constipation, GERD  Referring Provider: Lucius Krabbe, NP Primary GI MD: Sampson  HPI: Jamie Melton is a 53 y.o. female with past medical history of asthma, fibromyalgia, GERD, appendectomy who presents today for a complaint of abdominal pain.    Seen by PCP 12/05/2023 for complaint of left upper quadrant abdominal pain.  Protonix  ineffective.  Abdominal ultrasound ordered and advised Gas-X. Abdominal ultrasound 12/14/2023 showed possible mild fatty liver and otherwise normal.  Referred to GI for further evaluation.  Previously seen by [facility].  Had CT of the abdomen 07/26/2021 for 6 months of abdominal pain, and finding of moderate volume of formed stool throughout the colon suggestive of constipation, otherwise normal.   Patient states she has had intermittent LUQ abdominal pain for the last 5 years, as well as constipation and intermittent acid reflux.  Previously seen at [facility] for the same problems and states she had an EGD and colonoscopy in 2022.  Reports she had mild gastritis on EGD and nothing seen on colonoscopy though states she was told she had a long colon.  Pain in LUQ feels like it is in her abdomen directly below her rib cage.  Can have radiation to her back at times.  Typically has a bowel movement daily but often has sensation of incomplete evacuation.  Denies straining, blood in stool, melena.  States she has a small external hemorrhoid which is asymptomatic.  Denies any fecal incontinence.  Uses MiraLAX currently for management of constipation but does not feel like it is fully effective.  Generally feels better after a bowel movement.  Pain in LUQ is there every day, though comes and goes throughout the day.  No clear pattern to pain or association with eating or bowel movements.  Has used Gas-X in the past but is unsure if that helped.  She was not consistent with use.  Pain is not  severe, not interested in trying an antispasmodic at this time.  Has tried OTC treatments for constipation.  Senna caused a lot of abdominal cramping, as did laxative teas, though she did feel she was having more substantial bowel movements with these.  Denies any diarrhea.  If she does not take MiraLAX she will not have a bowel movement.  She is open to trying prescription treatment for constipation.  She takes Protonix  20 mg as needed for flareups of heartburn/reflux.  Not taking daily.  May take once a day for up to a week.  States that she has a flareup of symptoms monthly.  Feels a burning sensation in her epigastrium.  Has some mild nausea with reflux symptoms but denies any vomiting, fever, chills, dysphagia.  States she has previously tested negative for H. pylori.  Reports family history of esophageal in her grandfather who was a heavy smoker.  She would like to be tested for parasites.  States that she visits [place] frequently, last visited [place] in August of this year.  Drinks [place]/tap water.  Reports she had a parasitic infection many years ago.  Previous GI Procedures/Imaging   Abdominal ultrasound 12/14/2023 1. No acute abnormality identified. 2. Mild heterogeneous echotexture of the liver. This is nonspecific but can be seen in fatty infiltration of liver.  CT abdomen 07/26/2021 1. No acute findings within the abdomen. 2. Moderate volume of formed stool throughout the colon, suggestive of constipation.  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 140 at 28 weeks 218 at NOB.   Unspecified abortion, complicated by metabolic disorder, unspecified    Vaginal delivery 04/01/2013    Past Surgical History:  Procedure Laterality Date   ABDOMINOPLASTY  07/10/2022   APPENDECTOMY     BILATERAL SALPINGECTOMY Bilateral 03/30/2013   Procedure: BILATERAL SALPINGECTOMY;  Surgeon: Shanda SHAUNNA Muscat, MD;  Location: [facility];  Service: Obstetrics;  Laterality: Bilateral;   CESAREAN SECTION N/A 03/30/2013   Procedure: Primary CESAREAN SECTION of baby  boy at 0521 APGAR 8/9;  Surgeon: Shanda SHAUNNA Muscat, MD;  Location: [facility];  Service: Obstetrics;  Laterality: N/A;   COLONOSCOPY     NASAL SINUS SURGERY      Current Outpatient Medications  Medication Sig Dispense Refill   albuterol  (VENTOLIN  HFA) 108 (90 Base) MCG/ACT inhaler Inhale 2 puffs into the lungs every 6 (six) hours as needed. 6.7 g 1   budesonide -formoterol  (SYMBICORT ) 80-4.5 MCG/ACT inhaler Inhale 2 puffs into the lungs 2 (two) times daily. 10.2 g 5   Calcium Carb-Cholecalciferol (LIQUID CALCIUM WITH D3) 600-12.5 MG-MCG CAPS      etanercept  (ENBREL  SURECLICK) 50 MG/ML injection Inject 50 mg into the skin once a week. Subcutaneous 4 mL 2   fluticasone  (FLONASE ) 50 MCG/ACT nasal spray Place 2 sprays into both nostrils daily. 16 g 6   levocetirizine (XYZAL ) 5 MG tablet Take 1 tablet (5 mg total) by mouth every evening. 30 tablet 5   Multiple Vitamin (MULTIVITAMIN WITH MINERALS) TABS tablet Take 1 tablet by mouth daily.     pantoprazole  (PROTONIX ) 20 MG tablet Take 1 tablet (20 mg total) by mouth daily. Take 1 pill twice a day for 1- 2 weeks, then 1 pill every morning until symptoms are improved. 45 tablet 2   chlorhexidine  (PERIDEX ) 0.12 % solution Rinse  with 1/2 ounce for 30 seconds twice a day 473 mL 1   No current facility-administered medications for this visit.    Allergies as of 04/25/2024   (No Known Allergies)    Family History  Problem Relation Age of Onset   Arthritis Mother    Kidney disease Mother        STONES   Thyroid  disease Mother    Hypertension Father    Asthma Sister    Eczema Sister    Allergic rhinitis Sister    Eczema Sister    Asthma Sister    Allergic rhinitis Sister    Eczema Sister    Asthma Sister    Allergic rhinitis Sister     Social History   Tobacco Use   Smoking status: Never    Passive exposure: Past   Smokeless tobacco: Never  Vaping Use   Vaping status: Never Used  Substance Use Topics   Alcohol use: Yes    Alcohol/week: 3.0 standard drinks  of alcohol    Types: 1 Glasses of wine, 1 Cans of beer, 1 Shots of liquor per week    Comment: occasional   Drug use: No     Review of Systems:    Constitutional: No unintentional weight loss, fever, chills Cardiovascular: No chest pain Respiratory: No SOB  Gastrointestinal: See HPI and otherwise negative Hematologic: No bleeding or bruising   Physical Exam:  Vital signs: BP 110/64   Ht 5' 1 (1.549 m)   Wt 152 lb (68.9 kg)   BMI 28.72 kg/m   Constitutional: Pleasant, well-appearing female in NAD, alert and cooperative Head:  Normocephalic and atraumatic.  Eyes: No scleral icterus.  Respiratory: Respirations even and unlabored. Lungs clear to auscultation bilaterally.  No wheezes, crackles, or rhonchi.  Cardiovascular:  Regular rate and rhythm. No murmurs. No peripheral edema. Gastrointestinal:  Soft, nondistended, mildly tender to palpation of LUQ. No rebound or guarding. Normal bowel sounds. No appreciable masses or hepatomegaly. Rectal:  Not performed.  Neurologic:  Alert and oriented x4;  grossly normal neurologically.  Skin:   Dry and intact without significant lesions or rashes. Psychiatric: Oriented to person, place and  time. Demonstrates good judgement and reason without abnormal affect or behaviors.   RELEVANT LABS AND IMAGING: CBC    Component Value Date/Time   WBC 6.4 03/17/2024 1425   RBC 3.97 03/17/2024 1425   HGB 12.8 03/17/2024 1425   HGB 13.2 09/04/2023 1602   HCT 38.1 03/17/2024 1425   HCT 39.0 09/04/2023 1602   PLT 192.0 03/17/2024 1425   PLT 214 09/04/2023 1602   MCV 96.0 03/17/2024 1425   MCV 96 09/04/2023 1602   MCH 32.5 09/04/2023 1602   MCH 32.7 02/03/2022 1215   MCHC 33.5 03/17/2024 1425   RDW 12.8 03/17/2024 1425   RDW 11.8 09/04/2023 1602   LYMPHSABS 1.9 03/17/2024 1425   LYMPHSABS 3.2 (H) 09/04/2023 1602   MONOABS 0.4 03/17/2024 1425   EOSABS 0.6 03/17/2024 1425   EOSABS 0.4 09/04/2023 1602   BASOSABS 0.0 03/17/2024 1425   BASOSABS 0.1 09/04/2023 1602    CMP     Component Value Date/Time   NA 140 03/17/2024 1425   K 3.9 03/17/2024 1425   CL 103 03/17/2024 1425   CO2 29 03/17/2024 1425   GLUCOSE 86 03/17/2024 1425   BUN 13 03/17/2024 1425   CREATININE 0.79 03/17/2024 1425   CREATININE 0.76 05/21/2019 1443   CALCIUM 9.4 03/17/2024 1425   PROT 7.5 03/17/2024 1425   ALBUMIN 4.2 03/17/2024 1425   AST 18 03/17/2024 1425   ALT 19 03/17/2024 1425   ALKPHOS 71 03/17/2024 1425   BILITOT 0.3 03/17/2024 1425   GFRNONAA >60 02/03/2022 1215   GFRNONAA 79 04/09/2019 1536   GFRAA 92 04/09/2019 1536    Fibrosis 4 Score = 1.12 (Low risk)        Interpretation for patients with NAFLD          <1.30       -  F0-F1 (Low risk)          1.30-2.67 -  Indeterminate           >2.67      -  F3-F4 (High risk)     Validated for ages 10-65  Assessment/Plan:   GERD History of gastritis Patient reports history of intermittent GERD symptoms with epigastric burning and acid reflux.  Had an EGD in 2022 and reportedly had mild gastritis.  Has been taking Protonix  20 mg  daily as needed, up to a week at a time for flareups.  She does have a flareup about once a month requiring up to a  week of treatment with PPI.  Denies epigastric pain, dysphagia.  Has occasional nausea but denies vomiting.  - Request past GI records - Start Protonix  20 mg daily until follow-up to see if LUQ pain improves - Follow up 6-8 weeks  Chronic constipation Chronic LUQ abdominal pain Patient reports history of chronic constipation which is currently managed with MiraLAX, though she continues to have sensation of incomplete evacuation and feels that MiraLAX is not fully effective.  Open to trying a prescription treatment for constipation.  Also reports chronic LUQ abdominal pain.  Denies clear associations with eating or with bowel movements.  Pain occurs daily but comes and goes throughout the day.  Has had previous workup for this at [facility] with abdominal ultrasound, CT scan which showed constipation otherwise normal, EGD which showed gastritis and colonoscopy which showed a long colon per patient and otherwise normal. Travels to [place] frequently, last trip in August, and requests testing for ova and parasites. LUQ pain possibly due to constipation.  - Request past GI records - Stool ova and parasites - Start trial of Linzess 145 mcg, give samples  Hepatic steatosis Hepatic steatosis seen on ultrasound in May.  Has had normal liver enzymes and low risk fib 4 score.    - Advised avoidance of alcohol, weight loss, regular exercise, healthy diet with reduction in carbohydrates - Monitor CBC, CMP every 6 months   Addendum 06/10/2024: Past GI records received -   Colonoscopy 04/15/2021 Preparation of the colon was fair Perianal skin tags found on perianal exam Redundant colon Stool in the sigmoid colon and in the ascending colon Internal hemorrhoids No specimens collected Repeat colonoscopy in 5 years for screening purposes  EGD 04/15/2021 Z-line regular, 38 cm from the incisors Gastritis, biopsied Normal duodenal bulb, first portion of the duodenum, and second portion of the  duodenum Path: Chronic inactive gastritis with reactive/regenerative epithelium, negative for H. pylori   Camie Furbish, PA-C [facility] 04/25/2024, 10:09 AM  Patient Care Team: Lucius Krabbe, NP as PCP - General (Family Medicine)

## 2024-04-25 ENCOUNTER — Other Ambulatory Visit (HOSPITAL_COMMUNITY): Payer: Self-pay

## 2024-04-25 ENCOUNTER — Encounter: Payer: Self-pay | Admitting: Gastroenterology

## 2024-04-25 ENCOUNTER — Ambulatory Visit (INDEPENDENT_AMBULATORY_CARE_PROVIDER_SITE_OTHER)
Admission: RE | Admit: 2024-04-25 | Discharge: 2024-04-25 | Disposition: A | Discharge status: Home / Self Care | Source: Ambulatory Visit | Attending: Gastroenterology | Admitting: Gastroenterology

## 2024-04-25 ENCOUNTER — Other Ambulatory Visit: Payer: Self-pay

## 2024-04-25 ENCOUNTER — Ambulatory Visit: Admitting: Gastroenterology

## 2024-04-25 VITALS — BP 110/64 | Ht 61.0 in | Wt 152.0 lb

## 2024-04-25 DIAGNOSIS — K219 Gastro-esophageal reflux disease without esophagitis: Secondary | ICD-10-CM

## 2024-04-25 DIAGNOSIS — R1012 Left upper quadrant pain: Secondary | ICD-10-CM

## 2024-04-25 DIAGNOSIS — G8929 Other chronic pain: Secondary | ICD-10-CM

## 2024-04-25 DIAGNOSIS — K760 Fatty (change of) liver, not elsewhere classified: Secondary | ICD-10-CM | POA: Diagnosis not present

## 2024-04-25 DIAGNOSIS — K5909 Other constipation: Secondary | ICD-10-CM

## 2024-04-25 DIAGNOSIS — Z8719 Personal history of other diseases of the digestive system: Secondary | ICD-10-CM | POA: Diagnosis not present

## 2024-04-25 DIAGNOSIS — K5900 Constipation, unspecified: Secondary | ICD-10-CM | POA: Diagnosis not present

## 2024-04-25 MED ORDER — PANTOPRAZOLE SODIUM 20 MG PO TBEC
DELAYED_RELEASE_TABLET | ORAL | 3 refills | Status: AC
  Filled 2024-04-25: qty 30, 16d supply, fill #0
  Filled 2024-06-07: qty 30, 14d supply, fill #1
  Filled 2024-08-04: qty 30, 30d supply, fill #0
  Filled 2024-08-04: qty 30, fill #2

## 2024-04-25 MED ORDER — LINACLOTIDE 145 MCG PO CAPS
ORAL_CAPSULE | ORAL | 0 refills | Status: DC
Start: 2024-04-25 — End: 2024-04-30

## 2024-04-25 NOTE — Patient Instructions (Addendum)
 Your provider has requested that you go to the basement level for lab work before leaving today. Press B on the elevator. The lab is located at the first door on the left as you exit the elevator.  Due to recent changes in healthcare laws, you may see the results of your imaging and laboratory studies on MyChart before your provider has had a chance to review them.  We understand that in some cases there may be results that are confusing or concerning to you. Not all laboratory results come back in the same time frame and the provider may be waiting for multiple results in order to interpret others.  Please give us  48 hours in order for your provider to thoroughly review all the results before contacting the office for clarification of your results.    Your provider has requested that you have an abdominal x ray before leaving today. Please go to the basement floor to our Radiology department for the test.   We have given you samples of the following medication to take: Linzess 145 mcg, take 1 capsule 20-30 minutes before breakfast.  Thank you for trusting me with your gastrointestinal care!   Camie Furbish, PA-C  _______________________________________________________  If your blood pressure at your visit was 140/90 or greater, please contact your primary care physician to follow up on this.  _______________________________________________________  If you are age 9 or older, your body mass index should be between 23-30. Your Body mass index is 28.72 kg/m. If this is out of the aforementioned range listed, please consider follow up with your Primary Care Provider.  If you are age 72 or younger, your body mass index should be between 19-25. Your Body mass index is 28.72 kg/m. If this is out of the aformentioned range listed, please consider follow up with your Primary Care Provider.   ________________________________________________________  The [facility] providers would like to  encourage you to use MYCHART to communicate with providers for non-urgent requests or questions.  Due to long hold times on the telephone, sending your provider a message by [facility] may be a faster and more efficient way to get a response.  Please allow 48 business hours for a response.  Please remember that this is for non-urgent requests.  _______________________________________________________  [facility] is using a team-based approach to care.  Your team is made up of your doctor and two to three APPS. Our APPS (Nurse Practitioners and Physician Assistants) work with your physician to ensure care continuity for you. They are fully qualified to address your health concerns and develop a treatment plan. They communicate directly with your gastroenterologist to care for you. Seeing the Advanced Practice Practitioners on your physician's team can help you by facilitating care more promptly, often allowing for earlier appointments, access to diagnostic testing, procedures, and other specialty referrals.

## 2024-04-27 ENCOUNTER — Encounter: Payer: Self-pay | Admitting: Gastroenterology

## 2024-04-28 ENCOUNTER — Other Ambulatory Visit

## 2024-04-28 DIAGNOSIS — Z8719 Personal history of other diseases of the digestive system: Secondary | ICD-10-CM

## 2024-04-28 DIAGNOSIS — K760 Fatty (change of) liver, not elsewhere classified: Secondary | ICD-10-CM

## 2024-04-28 DIAGNOSIS — K5909 Other constipation: Secondary | ICD-10-CM

## 2024-04-28 DIAGNOSIS — G8929 Other chronic pain: Secondary | ICD-10-CM | POA: Diagnosis not present

## 2024-04-28 DIAGNOSIS — K219 Gastro-esophageal reflux disease without esophagitis: Secondary | ICD-10-CM | POA: Diagnosis not present

## 2024-04-28 DIAGNOSIS — R1012 Left upper quadrant pain: Secondary | ICD-10-CM | POA: Diagnosis not present

## 2024-04-29 ENCOUNTER — Other Ambulatory Visit: Payer: Self-pay

## 2024-04-30 ENCOUNTER — Other Ambulatory Visit: Payer: Self-pay | Admitting: Gastroenterology

## 2024-04-30 ENCOUNTER — Other Ambulatory Visit: Payer: Self-pay

## 2024-04-30 ENCOUNTER — Other Ambulatory Visit (HOSPITAL_COMMUNITY): Payer: Self-pay

## 2024-04-30 LAB — OVA AND PARASITE EXAMINATION
CONCENTRATE RESULT:: NONE SEEN
MICRO NUMBER:: 17060885
SPECIMEN QUALITY:: ADEQUATE
TRICHROME RESULT:: NONE SEEN

## 2024-04-30 MED ORDER — LINACLOTIDE 145 MCG PO CAPS
145.0000 ug | ORAL_CAPSULE | Freq: Every day | ORAL | 3 refills | Status: DC
  Filled 2024-04-30 (×2): qty 30, 30d supply, fill #0

## 2024-05-01 ENCOUNTER — Other Ambulatory Visit (HOSPITAL_COMMUNITY): Payer: Self-pay

## 2024-05-01 ENCOUNTER — Ambulatory Visit: Payer: Self-pay | Admitting: Gastroenterology

## 2024-05-01 MED ORDER — TRULANCE 3 MG PO TABS
1.0000 | ORAL_TABLET | Freq: Every day | ORAL | 3 refills | Status: AC
  Filled 2024-05-01 – 2024-07-25 (×2): qty 30, 30d supply, fill #0
  Filled 2024-08-04: qty 30, 30d supply, fill #1
  Filled 2024-08-04: qty 30, 30d supply, fill #0
  Filled 2024-08-29: qty 90, 90d supply, fill #0

## 2024-05-01 NOTE — Telephone Encounter (Signed)
 When I tried to enter both Trulance and Amitiza it looks they are both preferred with some limit restrictions. I will reach out to the pt and see if she is interested in trying either of these medications.

## 2024-05-09 ENCOUNTER — Other Ambulatory Visit (HOSPITAL_COMMUNITY): Payer: Self-pay

## 2024-05-12 ENCOUNTER — Other Ambulatory Visit (HOSPITAL_COMMUNITY): Payer: Self-pay

## 2024-05-20 ENCOUNTER — Other Ambulatory Visit: Payer: Self-pay

## 2024-05-20 NOTE — Progress Notes (Signed)
 Specialty Pharmacy Refill Coordination Note  Jamie Melton is a 53 y.o. female contacted today regarding refills of specialty medication(s) Etanercept  (Enbrel  SureClick)   Patient requested Delivery   Delivery date: 05/30/24   Verified address: [address] [place] [address]   Medication will be filled on: 05/29/24

## 2024-05-20 NOTE — Progress Notes (Signed)
 Specialty Pharmacy Ongoing Clinical Assessment Note  Jamie Melton is a 53 y.o. female who is being followed by the specialty pharmacy service for RxSp Ankylosing Spondylitis   Patient's specialty medication(s) reviewed today: Etanercept  (Enbrel  SureClick)   Missed doses in the last 4 weeks: 0   Patient/Caregiver did not have any additional questions or concerns.   Therapeutic benefit summary: Patient is achieving benefit   Adverse events/side effects summary: No adverse events/side effects   Patient's therapy is appropriate to: Continue    Goals Addressed             This Visit's Progress    Maintain optimal adherence to therapy   On track    Patient is on track. Patient will maintain adherence         Follow up: 12 months  Silvano LOISE Dolly Specialty Pharmacist

## 2024-05-29 ENCOUNTER — Other Ambulatory Visit: Payer: Self-pay

## 2024-06-04 DIAGNOSIS — M459 Ankylosing spondylitis of unspecified sites in spine: Secondary | ICD-10-CM | POA: Diagnosis not present

## 2024-06-04 DIAGNOSIS — M4690 Unspecified inflammatory spondylopathy, site unspecified: Secondary | ICD-10-CM | POA: Diagnosis not present

## 2024-06-04 DIAGNOSIS — Z79899 Other long term (current) drug therapy: Secondary | ICD-10-CM | POA: Diagnosis not present

## 2024-06-04 DIAGNOSIS — R0781 Pleurodynia: Secondary | ICD-10-CM | POA: Diagnosis not present

## 2024-06-04 DIAGNOSIS — Z227 Latent tuberculosis: Secondary | ICD-10-CM | POA: Diagnosis not present

## 2024-06-07 ENCOUNTER — Other Ambulatory Visit: Payer: Self-pay | Admitting: Internal Medicine

## 2024-06-09 ENCOUNTER — Other Ambulatory Visit (HOSPITAL_COMMUNITY): Payer: Self-pay

## 2024-06-09 ENCOUNTER — Other Ambulatory Visit: Payer: Self-pay

## 2024-06-09 MED ORDER — ALBUTEROL SULFATE HFA 108 (90 BASE) MCG/ACT IN AERS
2.0000 | INHALATION_SPRAY | Freq: Four times a day (QID) | RESPIRATORY_TRACT | 0 refills | Status: DC | PRN
  Filled 2024-06-09: qty 6.7, 25d supply, fill #0

## 2024-06-10 ENCOUNTER — Telehealth: Payer: Self-pay | Admitting: Gastroenterology

## 2024-06-10 NOTE — Telephone Encounter (Signed)
 Past GI records received and reviewed.  Patient last had a colonoscopy 04/15/2021 with fair prep and recall recommended in 5 years for screening purposes.  She did not have colon polyps. Please set colonoscopy recall for 04/15/2026 with Dr. Nandigam, with 2 day prep.

## 2024-06-11 NOTE — Telephone Encounter (Signed)
 Done

## 2024-06-13 ENCOUNTER — Encounter: Payer: Self-pay | Admitting: Gastroenterology

## 2024-06-13 ENCOUNTER — Ambulatory Visit: Admitting: Gastroenterology

## 2024-06-13 VITALS — BP 110/62 | HR 69 | Ht 61.5 in | Wt 155.0 lb

## 2024-06-13 DIAGNOSIS — R1012 Left upper quadrant pain: Secondary | ICD-10-CM | POA: Diagnosis not present

## 2024-06-13 DIAGNOSIS — K219 Gastro-esophageal reflux disease without esophagitis: Secondary | ICD-10-CM | POA: Diagnosis not present

## 2024-06-13 DIAGNOSIS — K5909 Other constipation: Secondary | ICD-10-CM | POA: Diagnosis not present

## 2024-06-13 DIAGNOSIS — G8929 Other chronic pain: Secondary | ICD-10-CM

## 2024-06-13 NOTE — Patient Instructions (Addendum)
 We have given you samples of the following medication to take: Trulance  3 mg tablets, take 1 tablet daily.  Linzess  145 mg  Follow up in 6 months or sooner if needed.  Thank you for trusting me with your gastrointestinal care!   Camie Furbish, PA-C  _______________________________________________________  If your blood pressure at your visit was 140/90 or greater, please contact your primary care physician to follow up on this.  _______________________________________________________  If you are age 63 or older, your body mass index should be between 23-30. Your Body mass index is 28.81 kg/m. If this is out of the aforementioned range listed, please consider follow up with your Primary Care Provider.  If you are age 50 or younger, your body mass index should be between 19-25. Your Body mass index is 28.81 kg/m. If this is out of the aformentioned range listed, please consider follow up with your Primary Care Provider.   ________________________________________________________  The [facility] providers would like to encourage you to use MYCHART to communicate with providers for non-urgent requests or questions.  Due to long hold times on the telephone, sending your provider a message by [facility] may be a faster and more efficient way to get a response.  Please allow 48 business hours for a response.  Please remember that this is for non-urgent requests.  _______________________________________________________  [facility] is using a team-based approach to care.  Your team is made up of your doctor and two to three APPS. Our APPS (Nurse Practitioners and Physician Assistants) work with your physician to ensure care continuity for you. They are fully qualified to address your health concerns and develop a treatment plan. They communicate directly with your gastroenterologist to care for you. Seeing the Advanced Practice Practitioners on your physician's team can help you by  facilitating care more promptly, often allowing for earlier appointments, access to diagnostic testing, procedures, and other specialty referrals.

## 2024-06-13 NOTE — Progress Notes (Signed)
 Jamie Melton [id number] 08-04-1970   Chief Complaint: Constipation  Referring Provider: Lucius Krabbe, NP Primary GI MD: Dr. Shila  HPI: Jamie Melton is a 53 y.o. female with past medical history of asthma, fibromyalgia, GERD, appendectomy who presents today for a complaint of constipation.    Seen by PCP 12/05/2023 for complaint of left upper quadrant abdominal pain.  Protonix  ineffective.  Abdominal ultrasound ordered and advised Gas-X. Abdominal ultrasound 12/14/2023 showed possible mild fatty liver and otherwise normal.  Referred to GI for further evaluation.   Previously seen by [facility].  Had CT of the abdomen 07/26/2021 for 6 months of abdominal pain, and finding of moderate volume of formed stool throughout the colon suggestive of constipation, otherwise normal.  At last visit patient endorsed intermittent GERD symptoms with epigastric burning and acid reflux.-Take Protonix  20 mg daily as needed up to a week at a time but not on a consistent basis.  Was advised to take daily until follow-up to see if LUQ pain improved.  She has chronic LUQ abdominal pain and has had workup for this to include abdominal ultrasound, CT, EGD and colonoscopy in 2022.  She had recently traveled to [place] and requested testing for ova and parasite's.  Discussed that LUQ pain was possibly due to constipation and she was given samples of Linzess  145 mcg.  Past records were received.  She had an upper endoscopy in 2022 which showed gastritis which was negative for H. pylori.  Had a colonoscopy which revealed a redundant colon, perianal skin tags, internal hemorrhoids.  Prep was fair and recall was recommended in 5 years.  Added to colonoscopy recall, with 2-day prep.  Patient also has hepatic steatosis as seen on ultrasound in May.  Has had normal liver enzymes and low risk fib 4 score.  Abdominal x-ray showed moderate amount of stool in the colon consistent with constipation.  Stool test was  negative for parasites.  Linzess  was too expensive.  Trulance  too expensive.  Does not appear we have tried Amitiza.  Discussed the use of AI scribe software for clinical note transcription with the patient, who gave verbal consent to proceed.  History of Present Illness Jamie Melton is a 53 year old female with chronic constipation and gastroesophageal reflux disease who presents for follow-up of her gastrointestinal symptoms.  Constipation and abdominal pain - Persistent constipation and gas, frequently preceding episodes of left upper quadrant abdominal pain - Pain is associated with episodes of constipation and gas - Linzess  effective for constipation but costly; plans to retry after insurance changes in January - Currently uses Miralax daily and has added Colace to aid bowel movements - Frequent bowel movements but sensation of incomplete evacuation - Increasing fiber intake has led to some improvement in symptoms - Last colonoscopy performed in 2022; due for recall in 2027  Gastroesophageal reflux disease - Symptoms have improved with consistent daily use of medication - Previously inconsistent with medication regimen - Currently taking 20 mg pantoprazole   - Last upper endoscopy showed gastritis  Associated symptoms and negative findings - No chest pain - No shortness of breath - No blood in stool - No black stools   Previous GI Procedures/Imaging   Colonoscopy 04/15/2021 Preparation of the colon was fair Perianal skin tags found on perianal exam Redundant colon Stool in the sigmoid colon and in the ascending colon Internal hemorrhoids No specimens collected Repeat colonoscopy in 5 years for screening purposes   EGD 04/15/2021 Z-line regular, 38 cm  from the incisors Gastritis, biopsied Normal duodenal bulb, first portion of the duodenum, and second portion of the duodenum Path: Chronic inactive gastritis with reactive/regenerative epithelium, negative for H.  pylori  Abdominal ultrasound 12/14/2023 1. No acute abnormality identified. 2. Mild heterogeneous echotexture of the liver. This is nonspecific but can be seen in fatty infiltration of liver.   CT abdomen 07/26/2021 1. No acute findings within the abdomen. 2. Moderate volume of formed stool throughout the colon, suggestive of constipation.   Abdominal ultrasound 01/10/2021 No acute process or explanation for patient's symptoms   Past Medical History:  Diagnosis Date   Acute non-recurrent frontal sinusitis 08/30/2021   Asthma    DR KOSLOW   Asthma 08/02/2011   Chest pain 08/02/2011   Encounter for sterilization 03/30/2013   Pt requests surgical sterilization   IMO SNOMED Dx Update Oct 2024     Fibroid 2012   Fibromyalgia 02/15/2022   GERD (gastroesophageal reflux disease)    Headache(784.0)    FREQUENT   Infection    UTI   Pain in joint, multiple sites    Palpable abd. aorta 08/02/2011   PROM (premature rupture of membranes) at term 03/29/2013   Spondylosis    Thrombocytopenia, unspecified 03/29/2013   Platelets 88 on admission. 140 at 28 weeks 218 at [facility].   Unspecified abortion, complicated by metabolic disorder, unspecified    Vaginal delivery 04/01/2013    Past Surgical History:  Procedure Laterality Date   ABDOMINOPLASTY  07/10/2022   APPENDECTOMY     BILATERAL SALPINGECTOMY Bilateral 03/30/2013   Procedure: BILATERAL SALPINGECTOMY;  Surgeon: Shanda SHAUNNA Muscat, MD;  Location: [facility];  Service: Obstetrics;  Laterality: Bilateral;   CESAREAN SECTION N/A 03/30/2013   Procedure: Primary CESAREAN SECTION of baby  boy at 0521 APGAR 8/9;  Surgeon: Shanda SHAUNNA Muscat, MD;  Location: [facility];  Service: Obstetrics;  Laterality: N/A;   COLONOSCOPY     NASAL SINUS SURGERY      Current Outpatient Medications  Medication Sig Dispense Refill   albuterol  (VENTOLIN  HFA) 108 (90 Base) MCG/ACT inhaler Inhale 2 puffs into the lungs every 6 (six) hours as needed. *Patient needs appt  before any more refills* 6.7 g 0   budesonide -formoterol  (SYMBICORT ) 80-4.5 MCG/ACT inhaler Inhale 2 puffs into the lungs 2 (two) times daily. 10.2 g 5   Calcium Carb-Cholecalciferol (LIQUID CALCIUM WITH D3) 600-12.5 MG-MCG CAPS      etanercept  (ENBREL  SURECLICK) 50 MG/ML injection Inject 50 mg into the skin once a week. Subcutaneous 4 mL 2   fluticasone  (FLONASE ) 50 MCG/ACT nasal spray Place 2 sprays into both nostrils daily. 16 g 6   levocetirizine (XYZAL ) 5 MG tablet Take 1 tablet (5 mg total) by mouth every evening. 30 tablet 5   Multiple Vitamin (MULTIVITAMIN WITH MINERALS) TABS tablet Take 1 tablet by mouth daily.     pantoprazole  (PROTONIX ) 20 MG tablet Take 1 tablet (20 mg total) by mouth 2 (two) times daily for 7-14 days, THEN 1 tablet (20 mg total) every morning until symptoms are improved. 30 tablet 3   Plecanatide  (TRULANCE ) 3 MG TABS Take 1 tablet (3 mg total) by mouth daily. (Patient not taking: Reported on 06/13/2024) 30 tablet 3   No current facility-administered medications for this visit.    Allergies as of 06/13/2024   (No Known Allergies)    Family History  Problem Relation Age of Onset   Arthritis Mother    Kidney disease Mother  STONES   Thyroid  disease Mother    Hypertension Father    Asthma Sister    Eczema Sister    Allergic rhinitis Sister    Eczema Sister    Asthma Sister    Allergic rhinitis Sister    Eczema Sister    Asthma Sister    Allergic rhinitis Sister     Social History   Tobacco Use   Smoking status: Never    Passive exposure: Past   Smokeless tobacco: Never  Vaping Use   Vaping status: Never Used  Substance Use Topics   Alcohol use: Yes    Alcohol/week: 3.0 standard drinks of alcohol    Types: 1 Glasses of wine, 1 Cans of beer, 1 Shots of liquor per week    Comment: occasional   Drug use: No     Review of Systems:    Constitutional: No weight loss, fever, chills Cardiovascular: No chest pain Respiratory: No SOB   Gastrointestinal: See HPI and otherwise negative   Physical Exam:  Vital signs: BP 110/62   Pulse 69   Ht 5' 1.5 (1.562 m)   Wt 155 lb (70.3 kg)   BMI 28.81 kg/m   Constitutional: Pleasant, well-appearing female in NAD, alert and cooperative Head:  Normocephalic and atraumatic.  Eyes: No scleral icterus.  Respiratory: Respirations even and unlabored. Lungs clear to auscultation bilaterally.  No wheezes, crackles, or rhonchi.  Cardiovascular:  Regular rate and rhythm. No murmurs. No peripheral edema. Gastrointestinal:  Soft, nondistended, minimal tenderness to palpation of LUQ. No rebound or guarding. Normal bowel sounds. No appreciable masses or hepatomegaly. Rectal:  Not performed.  Neurologic:  Alert and oriented x4;  grossly normal neurologically.  Skin:   Dry and intact without significant lesions or rashes. Psychiatric: Oriented to person, place and time. Demonstrates good judgement and reason without abnormal affect or behaviors.   RELEVANT LABS AND IMAGING: CBC    Component Value Date/Time   WBC 6.4 03/17/2024 1425   RBC 3.97 03/17/2024 1425   HGB 12.8 03/17/2024 1425   HGB 13.2 09/04/2023 1602   HCT 38.1 03/17/2024 1425   HCT 39.0 09/04/2023 1602   PLT 192.0 03/17/2024 1425   PLT 214 09/04/2023 1602   MCV 96.0 03/17/2024 1425   MCV 96 09/04/2023 1602   MCH 32.5 09/04/2023 1602   MCH 32.7 02/03/2022 1215   MCHC 33.5 03/17/2024 1425   RDW 12.8 03/17/2024 1425   RDW 11.8 09/04/2023 1602   LYMPHSABS 1.9 03/17/2024 1425   LYMPHSABS 3.2 (H) 09/04/2023 1602   MONOABS 0.4 03/17/2024 1425   EOSABS 0.6 03/17/2024 1425   EOSABS 0.4 09/04/2023 1602   BASOSABS 0.0 03/17/2024 1425   BASOSABS 0.1 09/04/2023 1602    CMP     Component Value Date/Time   NA 140 03/17/2024 1425   K 3.9 03/17/2024 1425   CL 103 03/17/2024 1425   CO2 29 03/17/2024 1425   GLUCOSE 86 03/17/2024 1425   BUN 13 03/17/2024 1425   CREATININE 0.79 03/17/2024 1425   CREATININE 0.76 05/21/2019  1443   CALCIUM 9.4 03/17/2024 1425   PROT 7.5 03/17/2024 1425   ALBUMIN 4.2 03/17/2024 1425   AST 18 03/17/2024 1425   ALT 19 03/17/2024 1425   ALKPHOS 71 03/17/2024 1425   BILITOT 0.3 03/17/2024 1425   GFRNONAA >60 02/03/2022 1215   GFRNONAA 79 04/09/2019 1536   GFRAA 92 04/09/2019 1536     Assessment/Plan:   Assessment & Plan Chronic LUQ abdominal pain Chronic  constipation Abdominal pain and gas likely attributed to constipation. Linzess  effective but costly. Trulance  and Amitiza also cost prohibitive. Miralax and fiber intake provide some relief. Has recently started colace.  - Continue Miralax daily. - Start Colace as needed. - Increase fiber intake. - Will have an insurance change in January, we will try to get medications approved again at that time  Gastroesophageal reflux disease Symptoms improved with daily use of Protonix  20 mg  - Continue Protonix  20 mg daily   Camie Furbish, PA-C  Gastroenterology 06/13/2024, 12:47 PM  Patient Care Team: Lucius Krabbe, NP as PCP - General (Family Medicine)

## 2024-06-18 ENCOUNTER — Ambulatory Visit: Attending: Family | Admitting: Pharmacist

## 2024-06-18 DIAGNOSIS — Z79899 Other long term (current) drug therapy: Secondary | ICD-10-CM

## 2024-06-18 NOTE — Progress Notes (Signed)
   S: Patient presents for review of their specialty medication therapy.  Patient is currently taking Enbrel  for ankylosing spondylitis. Patient is managed by Dr. Aryal for this.   Adherence: confirms  Efficacy: reports that it is working well for her   Dosing: 50mg  subq once weekly   Ankylosing spondylitis, psoriatic arthritis, rheumatoid arthritis: SubQ: Note: May continue methotrexate, glucocorticoids, salicylates, NSAIDs, or analgesics during etanercept  therapy. Once-weekly dosing: 50 mg once weekly; maximum dose (rheumatoid arthritis): 50 mg/week. Twice-weekly dosing (off-label dose): 25 mg twice weekly Margy 2000; Calin 2004; Nicholaus 2003; Denita 2002; [place] 2000; [place] 2002)  Screening: TB test: completed  Hepatitis B: completed   Monitoring: Injection site reactions: none S/sx of infections: none S/sx of malignancy: none GI upset: none  O: Lab Results  Component Value Date   WBC 6.4 03/17/2024   HGB 12.8 03/17/2024   HCT 38.1 03/17/2024   MCV 96.0 03/17/2024   PLT 192.0 03/17/2024      Chemistry      Component Value Date/Time   NA 140 03/17/2024 1425   K 3.9 03/17/2024 1425   CL 103 03/17/2024 1425   CO2 29 03/17/2024 1425   BUN 13 03/17/2024 1425   CREATININE 0.79 03/17/2024 1425   CREATININE 0.76 05/21/2019 1443      Component Value Date/Time   CALCIUM 9.4 03/17/2024 1425   ALKPHOS 71 03/17/2024 1425   AST 18 03/17/2024 1425   ALT 19 03/17/2024 1425   BILITOT 0.3 03/17/2024 1425       A/P: 1. Medication review: patient currently taking Enbrel  for ankylosing spondylitis. Reviewed the medication with the patient, including the following: Enbrel  (etanercept ) binds tumor necrosis factor (TNF) and blocks its interaction with cell surface receptors. TNF plays an important role in the inflammatory processes of many diseases. Patient educated on purpose, proper use and potential adverse effects of Enbrel . SubQ: Administer subcutaneously. Rotate injection  sites; may inject into the thigh (preferred), abdomen (avoiding the 2-inch area around the navel), or outer areas of upper arm. New injections should be given at least 1 inch from an old site and never into areas where the skin is tender, bruised, red, or hard; any raised thick, red, or scaly skin patches or lesions; or areas with scars or stretch marks. For a more comfortable injection, allow autoinjectors, prefilled syringes, and dose trays to reach room temperature for 15 to 30 minutes (>=30 minutes for autoinjector) prior to injection; do not remove the needle cover while allowing product to reach room temperature. There may be small white particles of protein in the solution; this is not unusual for proteinaceous solutions. Possible adverse effects include rash, GI upset, increased risk of infection, and injection site reactions. Patients should stop Enbrel  if they develop a serious infection. There is a possible increased risk in lymphoma and other malignancies. No recommendations for any changes.   Herlene Fleeta Morris, PharmD, JAQUELINE, CPP Clinical Pharmacist [facility] [phone]

## 2024-06-20 ENCOUNTER — Other Ambulatory Visit (HOSPITAL_COMMUNITY): Payer: Self-pay

## 2024-06-24 ENCOUNTER — Other Ambulatory Visit (HOSPITAL_COMMUNITY): Payer: Self-pay

## 2024-06-24 ENCOUNTER — Other Ambulatory Visit: Payer: Self-pay | Admitting: Pharmacist

## 2024-06-24 ENCOUNTER — Other Ambulatory Visit: Payer: Self-pay

## 2024-06-24 MED ORDER — ENBREL SURECLICK 50 MG/ML ~~LOC~~ SOAJ: SUBCUTANEOUS | 2 refills | Status: DC

## 2024-06-24 MED ORDER — ENBREL SURECLICK 50 MG/ML ~~LOC~~ SOAJ
SUBCUTANEOUS | 2 refills | Status: AC
  Filled 2024-06-24: qty 4, fill #0
  Filled 2024-06-26: qty 4, 28d supply, fill #0
  Filled 2024-07-30: qty 4, 28d supply, fill #1
  Filled 2024-08-26: qty 4, 28d supply, fill #2

## 2024-06-25 ENCOUNTER — Other Ambulatory Visit (HOSPITAL_COMMUNITY): Payer: Self-pay

## 2024-06-26 ENCOUNTER — Other Ambulatory Visit: Payer: Self-pay

## 2024-06-26 NOTE — Progress Notes (Signed)
 Specialty Pharmacy Refill Coordination Note  Jamie Melton is a 53 y.o. female contacted today regarding refills of specialty medication(s) Etanercept  (Enbrel  SureClick)   Patient requested Delivery   Delivery date: 07/10/24   Verified address: [address] [place] [address]   Medication will be filled on: 07/09/24

## 2024-06-30 ENCOUNTER — Other Ambulatory Visit (HOSPITAL_COMMUNITY): Payer: Self-pay

## 2024-07-09 ENCOUNTER — Other Ambulatory Visit: Payer: Self-pay

## 2024-07-25 ENCOUNTER — Other Ambulatory Visit (HOSPITAL_COMMUNITY): Payer: Self-pay

## 2024-07-25 ENCOUNTER — Encounter (HOSPITAL_COMMUNITY): Payer: Self-pay

## 2024-07-25 ENCOUNTER — Telehealth: Admitting: Physician Assistant

## 2024-07-25 DIAGNOSIS — B3731 Acute candidiasis of vulva and vagina: Secondary | ICD-10-CM

## 2024-07-25 MED ORDER — FLUCONAZOLE 150 MG PO TABS
150.0000 mg | ORAL_TABLET | ORAL | 0 refills | Status: AC | PRN
  Filled 2024-07-25: qty 2, 6d supply, fill #0

## 2024-07-25 NOTE — Progress Notes (Signed)

## 2024-07-29 ENCOUNTER — Other Ambulatory Visit (HOSPITAL_COMMUNITY): Payer: Self-pay

## 2024-07-30 ENCOUNTER — Other Ambulatory Visit: Payer: Self-pay

## 2024-08-01 ENCOUNTER — Other Ambulatory Visit: Payer: Self-pay

## 2024-08-01 ENCOUNTER — Other Ambulatory Visit: Payer: Self-pay | Admitting: Pharmacy Technician

## 2024-08-01 NOTE — Progress Notes (Signed)
 Specialty Pharmacy Refill Coordination Note  Jamie Melton is a 54 y.o. female contacted today regarding refills of specialty medication(s) Etanercept  (Enbrel  SureClick)   Patient requested Delivery   Delivery date: 08/06/24   Verified address: [address]   Medication will be filled on: 08/05/24

## 2024-08-04 ENCOUNTER — Other Ambulatory Visit: Payer: Self-pay

## 2024-08-04 ENCOUNTER — Other Ambulatory Visit (HOSPITAL_COMMUNITY): Payer: Self-pay

## 2024-08-04 ENCOUNTER — Other Ambulatory Visit: Payer: Self-pay | Admitting: Internal Medicine

## 2024-08-04 NOTE — Progress Notes (Signed)
 Clinical Intervention Note  Clinical Intervention Notes: Patient reported starting Trulance . No DDIs identified with Enbrel    Clinical Intervention Outcomes: Prevention of an adverse drug event   Advertising Account Planner

## 2024-08-05 ENCOUNTER — Other Ambulatory Visit: Payer: Self-pay

## 2024-08-05 ENCOUNTER — Other Ambulatory Visit (HOSPITAL_COMMUNITY): Payer: Self-pay

## 2024-08-05 MED ORDER — LEVOCETIRIZINE DIHYDROCHLORIDE 5 MG PO TABS
5.0000 mg | ORAL_TABLET | Freq: Every evening | ORAL | 0 refills | Status: AC
  Filled 2024-08-05: qty 30, 30d supply, fill #0

## 2024-08-05 MED ORDER — ALBUTEROL SULFATE HFA 108 (90 BASE) MCG/ACT IN AERS
2.0000 | INHALATION_SPRAY | Freq: Four times a day (QID) | RESPIRATORY_TRACT | 0 refills | Status: AC | PRN
  Filled 2024-08-05: qty 6.7, 25d supply, fill #0

## 2024-08-05 NOTE — Progress Notes (Signed)
 CARDIOLOGY CONSULT NOTE       Patient ID: Jamie Melton MRN: [id number] DOB/AGE: February 07, 1971 54 y.o.  Referring Physician: Self Primary Physician: Lucius Krabbe, NP Primary Cardiologist: New Reason for Consultation: CAD Risk   HPI:  . 54 y.o. self referred for risk of CAD. History of Fibromyalgia, GERD. 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 Exercise stress echo was normal. She does not have any significant risk factors for CAD.   SSCP for a few months Non exertional. Mostly at night laying down does have GERD but thinks this is different  Still doing float nursing. Has a female partner live in for last 7 years Has 67 yo son Fonda She is sedentary  ROS All other systems reviewed and negative except as noted above  Past Medical History:  Diagnosis Date   Acute non-recurrent frontal sinusitis 08/30/2021   Asthma    DR KOSLOW   Asthma 08/02/2011   Chest pain 08/02/2011   Encounter for sterilization 03/30/2013   Pt requests surgical sterilization   IMO SNOMED Dx Update Oct 2024     Fibroid 2012   Fibromyalgia 02/15/2022   GERD (gastroesophageal reflux disease)    Headache(784.0)    FREQUENT   Infection    UTI   Pain in joint, multiple sites    Palpable abd. aorta 08/02/2011   PROM (premature rupture of membranes) at term 03/29/2013   Spondylosis    Thrombocytopenia, unspecified 03/29/2013   Platelets 88 on admission. 140 at 28 weeks 218 at [facility].   Unspecified abortion, complicated by metabolic disorder, unspecified    Vaginal delivery 04/01/2013    Family History  Problem Relation Age of Onset   Arthritis Mother    Kidney disease Mother        STONES   Thyroid  disease Mother    Hypertension Father    Asthma Sister    Eczema Sister    Allergic rhinitis Sister    Eczema Sister    Asthma Sister    Allergic rhinitis Sister    Eczema Sister    Asthma Sister    Allergic rhinitis Sister     Social History    Socioeconomic History   Marital status: Media Planner    Spouse name: Not on file   Number of children: Not on file   Years of education: 20+   Highest education level: Bachelor's degree (e.g., BA, AB, BS)  Occupational History   Occupation: REGISTERED NURSE    Employer: [employer]  Tobacco Use   Smoking status: Never    Passive exposure: Past   Smokeless tobacco: Never  Vaping Use   Vaping status: Never Used  Substance and Sexual Activity   Alcohol use: Yes    Alcohol/week: 3.0 standard drinks of alcohol    Types: 1 Glasses of wine, 1 Cans of beer, 1 Shots of liquor per week    Comment: occasional   Drug use: No   Sexual activity: Yes    Partners: Male    Birth control/protection: None, Surgical    Comment: Tubal ligation  Other Topics Concern   Not on file  Social History Narrative   Not on file   Social Drivers of Health   Tobacco Use: Low Risk (08/15/2024)   Patient History    Smoking Tobacco Use: Never    Smokeless Tobacco Use: Never    Passive Exposure: Past  Financial Resource Strain: Low Risk (03/16/2024)  Overall Financial Resource Strain (CARDIA)    Difficulty of Paying Living Expenses: Not hard at all  Food Insecurity: No Food Insecurity (03/16/2024)   Epic    Worried About Programme Researcher, Broadcasting/film/video in the Last Year: Never true    Ran Out of Food in the Last Year: Never true  Transportation Needs: No Transportation Needs (03/16/2024)   Epic    Lack of Transportation (Medical): No    Lack of Transportation (Non-Medical): No  Physical Activity: Insufficiently Active (03/16/2024)   Exercise Vital Sign    Days of Exercise per Week: 2 days    Minutes of Exercise per Session: 20 min  Stress: Stress Concern Present (03/16/2024)   [employer] of Occupational Health - Occupational Stress Questionnaire    Feeling of Stress: To some extent  Social Connections: Moderately Integrated (03/16/2024)   Social Connection and Isolation Panel    Frequency of  Communication with Friends and Family: More than three times a week    Frequency of Social Gatherings with Friends and Family: Once a week    Attends Religious Services: Patient declined    Active Member of Clubs or Organizations: Yes    Attends Engineer, Structural: More than 4 times per year    Marital Status: Living with partner  Intimate Partner Violence: Not on file  Depression (PHQ2-9): Low Risk (03/16/2023)   Depression (PHQ2-9)    PHQ-2 Score: 0  Alcohol Screen: Low Risk (03/16/2024)   Alcohol Screen    Last Alcohol Screening Score (AUDIT): 4  Housing: Low Risk (03/16/2024)   Epic    Unable to Pay for Housing in the Last Year: No    Number of Times Moved in the Last Year: 0    Homeless in the Last Year: No  Utilities: Not on file  Health Literacy: Not on file    Past Surgical History:  Procedure Laterality Date   ABDOMINOPLASTY  07/10/2022   APPENDECTOMY     BILATERAL SALPINGECTOMY Bilateral 03/30/2013   Procedure: BILATERAL SALPINGECTOMY;  Surgeon: Shanda SHAUNNA Muscat, MD;  Location: [facility];  Service: Obstetrics;  Laterality: Bilateral;   CESAREAN SECTION N/A 03/30/2013   Procedure: Primary CESAREAN SECTION of baby  boy at 0521 APGAR 8/9;  Surgeon: Shanda SHAUNNA Muscat, MD;  Location: [facility];  Service: Obstetrics;  Laterality: N/A;   COLONOSCOPY     NASAL SINUS SURGERY       Current Medications[1]    Physical Exam: Blood pressure 98/70, pulse 77, height 5' 1 (1.549 m), weight 154 lb (69.9 kg), SpO2 98%.    Affect appropriate Healthy:  appears stated age HEENT: normal Neck supple with no adenopathy JVP normal no bruits no thyromegaly Lungs clear with no wheezing and good diaphragmatic motion Heart:  S1/S2 no murmur, no rub, gallop or click PMI normal Abdomen: benighn, BS positve, no tenderness, no AAA no bruit.  No HSM or HJR Prior appendectomy  Distal pulses intact with no bruits No edema Neuro non-focal Skin warm and dry No muscular  weakness   Labs:   Lab Results  Component Value Date   WBC 6.4 03/17/2024   HGB 12.8 03/17/2024   HCT 38.1 03/17/2024   MCV 96.0 03/17/2024   PLT 192.0 03/17/2024   No results for input(s): NA, K, CL, CO2, BUN, CREATININE, CALCIUM, PROT, BILITOT, ALKPHOS, ALT, AST, GLUCOSE in the last 168 hours.  Invalid input(s): LABALBU No results found for: CKTOTAL, CKMB, CKMBINDEX, TROPONINI  Lab Results  Component Value Date  CHOL 166 03/17/2024   CHOL 165 02/15/2022   Lab Results  Component Value Date   HDL 61.50 03/17/2024   HDL 63.00 02/15/2022   Lab Results  Component Value Date   LDLCALC 94 03/17/2024   LDLCALC 94 02/15/2022   Lab Results  Component Value Date   TRIG 53.0 03/17/2024   TRIG 37.0 02/15/2022   Lab Results  Component Value Date   CHOLHDL 3 03/17/2024   CHOLHDL 3 02/15/2022   No results found for: LDLDIRECT    Radiology: No results found.  EKG: NSR normal    ASSESSMENT AND PLAN:   CAD:  risk in absence of risk factors. Normal stress echo in 2013 Favor risk stratification with coronary CTA. had some hepatic steatosis in past  Protonix  as needed Asthma:  with allergies use xyzal  and albuterol /symbicort  as needed  Non smoker   Coronary CTA BMET Lopressor 50 mg 2 hours prior  F/U PRN  if scan low risk   Signed: Maude Emmer 08/15/2024, 4:15 PM       [1]  Current Outpatient Medications:    albuterol  (VENTOLIN  HFA) 108 (90 Base) MCG/ACT inhaler, Inhale 2 puffs into the lungs every 6 (six) hours as needed. *Patient needs appt before any more refills*, Disp: 6.7 g, Rfl: 0   budesonide -formoterol  (SYMBICORT ) 80-4.5 MCG/ACT inhaler, Inhale 2 puffs into the lungs 2 (two) times daily., Disp: 10.2 g, Rfl: 5   Calcium Carb-Cholecalciferol (LIQUID CALCIUM WITH D3) 600-12.5 MG-MCG CAPS, , Disp: , Rfl:    etanercept  (ENBREL  SURECLICK) 50 MG/ML injection, Inject 50 mg into the skin once a week. Subcutaneous, Disp: 4 mL,  Rfl: 2   fluconazole  (DIFLUCAN ) 150 MG tablet, Take 1 tablet (150 mg total) by mouth every 3 (three) days as needed., Disp: 2 tablet, Rfl: 0   fluticasone  (FLONASE ) 50 MCG/ACT nasal spray, Place 2 sprays into both nostrils daily., Disp: 16 g, Rfl: 6   levocetirizine (XYZAL ) 5 MG tablet, Take 1 tablet (5 mg total) by mouth every evening., Disp: 30 tablet, Rfl: 0   Multiple Vitamin (MULTIVITAMIN WITH MINERALS) TABS tablet, Take 1 tablet by mouth daily., Disp: , Rfl:    pantoprazole  (PROTONIX ) 20 MG tablet, Take 1 tablet (20 mg total) by mouth 2 (two) times daily for 7-14 days, THEN 1 tablet (20 mg total) every morning until symptoms are improved., Disp: 30 tablet, Rfl: 3   Plecanatide  (TRULANCE ) 3 MG TABS, Take 1 tablet (3 mg total) by mouth daily., Disp: 30 tablet, Rfl: 3

## 2024-08-08 ENCOUNTER — Other Ambulatory Visit (HOSPITAL_COMMUNITY): Payer: Self-pay

## 2024-08-15 ENCOUNTER — Telehealth: Payer: Self-pay | Admitting: Licensed Clinical Social Worker

## 2024-08-15 ENCOUNTER — Other Ambulatory Visit (HOSPITAL_COMMUNITY): Payer: Self-pay

## 2024-08-15 ENCOUNTER — Ambulatory Visit: Attending: Cardiovascular Disease | Admitting: Cardiovascular Disease

## 2024-08-15 ENCOUNTER — Encounter: Payer: Self-pay | Admitting: Cardiovascular Disease

## 2024-08-15 VITALS — BP 98/70 | HR 77 | Ht 61.0 in | Wt 154.0 lb

## 2024-08-15 DIAGNOSIS — J4540 Moderate persistent asthma, uncomplicated: Secondary | ICD-10-CM | POA: Diagnosis not present

## 2024-08-15 DIAGNOSIS — K219 Gastro-esophageal reflux disease without esophagitis: Secondary | ICD-10-CM

## 2024-08-15 DIAGNOSIS — R079 Chest pain, unspecified: Secondary | ICD-10-CM | POA: Diagnosis not present

## 2024-08-15 LAB — BASIC METABOLIC PANEL WITH GFR
BUN/Creatinine Ratio: 15 (ref 9–23)
BUN: 14 mg/dL (ref 6–24)
CO2: 25 mmol/L (ref 20–29)
Calcium: 9 mg/dL (ref 8.7–10.2)
Chloride: 104 mmol/L (ref 96–106)
Creatinine, Ser: 0.92 mg/dL (ref 0.57–1.00)
Glucose: 76 mg/dL (ref 70–99)
Potassium: 4.1 mmol/L (ref 3.5–5.2)
Sodium: 140 mmol/L (ref 134–144)
eGFR: 75 mL/min/1.73

## 2024-08-15 MED ORDER — METOPROLOL TARTRATE 50 MG PO TABS
50.0000 mg | ORAL_TABLET | Freq: Every day | ORAL | 0 refills | Status: AC
  Filled 2024-08-15: qty 1, 1d supply, fill #0

## 2024-08-15 NOTE — Patient Instructions (Signed)
 Medication Instructions:  Metoprolol Tartrate 50 mg take 1 tablet 2 hours prior to cardiac ct  *If you need a refill on your cardiac medications before your next appointment, please call your pharmacy*  Lab Work: BMET today  Testing/Procedures:   Your cardiac CT will be scheduled at one of the below locations:   [facility] [address]  [place], [place] [address]  If scheduled at the [facility] at [address], please enter the parking lot using the [address] entrance and use the FREE valet service at the patient drop-off area. Enter the building and check-in with registration on the main floor.  If scheduled at [facility], please arrive to the Heart and Vascular Center 15 mins early for check-in and test prep.  There is spacious parking and easy access to the radiology department from the [facility] entrance. Please enter here and check-in with the desk attendant.   If scheduled at [facility], please arrive 30 minutes early for check-in and test prep.  Please follow these instructions carefully (unless otherwise directed):  An IV will be required for this test and Nitroglycerin will be given.  Hold all erectile dysfunction medications at least 3 days (72 hrs) prior to test. (Ie viagra, cialis, sildenafil, tadalafil, etc)   On the Night Before the Test: Be sure to Drink plenty of water. Do not consume any caffeinated/decaffeinated beverages or chocolate 12 hours prior to your test. Do not take any antihistamines 12 hours prior to your test.  On the Day of the Test: Drink plenty of water until 1 hour prior to the test. Do not eat any food 1 hour prior to test. You may take your regular medications prior to the test.  Take metoprolol (Lopressor) two hours prior to test. If you take Furosemide/Hydrochlorothiazide/Spironolactone/Chlorthalidone, please HOLD on the morning of the  test. Patients who wear a continuous glucose monitor MUST remove the device prior to scanning. FEMALES- please wear underwire-free bra if available, avoid dresses & tight clothing        After the Test: Drink plenty of water. After receiving IV contrast, you may experience a mild flushed feeling. This is normal. On occasion, you may experience a mild rash up to 24 hours after the test. This is not dangerous. If this occurs, you can take Benadryl  25 mg, Zyrtec, Claritin, or Allegra and increase your fluid intake. (Patients taking Tikosyn should avoid Benadryl , and may take Zyrtec, Claritin, or Allegra) If you experience trouble breathing, this can be serious. If it is severe call 911 IMMEDIATELY. If it is mild, please call our office.  We will call to schedule your test 2-4 weeks out understanding that some insurance companies will need an authorization prior to the service being performed.   For more information and frequently asked questions, please visit our website : [email or web address]  For non-scheduling related questions, please contact the cardiac imaging nurse navigator should you have any questions/concerns: Cardiac Imaging Nurse Navigators Direct Office Dial: [phone]   For scheduling needs, including cancellations and rescheduling, please call Brittany, [phone].   Follow-Up: At [facility], you and your health needs are our priority.  As part of our continuing mission to provide you with exceptional heart care, our providers are all part of one team.  This team includes your primary Cardiologist (physician) and Advanced Practice Providers or APPs (Physician Assistants and Nurse Practitioners) who all work together to provide you with  the care you need, when you need it.  Your next appointment:    As needed   Provider:   Dr. Delford   We recommend signing up for the patient portal called MyChart.  Sign up information is provided on this After  Visit Summary.  MyChart is used to connect with patients for Virtual Visits (Telemedicine).  Patients are able to view lab/test results, encounter notes, upcoming appointments, etc.  Non-urgent messages can be sent to your provider as well.   To learn more about what you can do with MyChart, go to [email or web address].   Other Instructions

## 2024-08-15 NOTE — Telephone Encounter (Signed)
 H&V Care Navigation CSW Progress Note  Clinical Social Worker noted pt initially populating as self pay - noted to have [employer] plan now populating at this time. Remain available as needed.  Patient is participating in a Managed Medicaid Plan:  No  SDOH Screenings   Food Insecurity: No Food Insecurity (03/16/2024)  Housing: Low Risk (03/16/2024)  Transportation Needs: No Transportation Needs (03/16/2024)  Alcohol Screen: Low Risk (03/16/2024)  Depression (PHQ2-9): Low Risk (03/16/2023)  Financial Resource Strain: Low Risk (03/16/2024)  Physical Activity: Insufficiently Active (03/16/2024)  Social Connections: Moderately Integrated (03/16/2024)  Stress: Stress Concern Present (03/16/2024)  Tobacco Use: Low Risk (06/13/2024)     Marit Lark, MSW, LCSW Clinical Social Worker II [facility] Heart/Vascular Care Navigation  [phone]- work cell phone (preferred)

## 2024-08-19 ENCOUNTER — Ambulatory Visit: Payer: Self-pay | Admitting: Cardiovascular Disease

## 2024-08-19 ENCOUNTER — Ambulatory Visit (HOSPITAL_COMMUNITY)
Admission: RE | Admit: 2024-08-19 | Discharge: 2024-08-19 | Disposition: A | Discharge status: Home / Self Care | Source: Ambulatory Visit | Attending: Cardiovascular Disease | Admitting: Cardiovascular Disease

## 2024-08-19 DIAGNOSIS — R079 Chest pain, unspecified: Secondary | ICD-10-CM | POA: Diagnosis not present

## 2024-08-19 MED ORDER — IOHEXOL 350 MG/ML SOLN
100.0000 mL | Freq: Once | INTRAVENOUS | Status: AC | PRN
  Administered 2024-08-19: 100 mL via INTRAVENOUS

## 2024-08-19 MED ORDER — NITROGLYCERIN 0.4 MG SL SUBL
0.8000 mg | SUBLINGUAL_TABLET | Freq: Once | SUBLINGUAL | Status: AC
  Administered 2024-08-19: 0.8 mg via SUBLINGUAL

## 2024-08-20 ENCOUNTER — Other Ambulatory Visit: Payer: Self-pay

## 2024-08-26 ENCOUNTER — Other Ambulatory Visit (HOSPITAL_COMMUNITY): Payer: Self-pay

## 2024-08-28 ENCOUNTER — Other Ambulatory Visit: Payer: Self-pay

## 2024-08-28 ENCOUNTER — Other Ambulatory Visit: Payer: Self-pay | Admitting: Pharmacy Technician

## 2024-08-28 NOTE — Progress Notes (Signed)
 Specialty Pharmacy Refill Coordination Note  [name] is a 54 y.o. female contacted today regarding refills of specialty medication(s) Etanercept  (Enbrel  SureClick)   Patient requested Delivery   Delivery date: 09/03/24   Verified address: [address]   Medication will be filled on: 09/02/24

## 2024-08-29 ENCOUNTER — Other Ambulatory Visit: Payer: Self-pay | Admitting: Internal Medicine

## 2024-08-29 ENCOUNTER — Other Ambulatory Visit: Payer: Self-pay

## 2024-08-29 ENCOUNTER — Other Ambulatory Visit (HOSPITAL_COMMUNITY): Payer: Self-pay
